# Patient Record
Sex: Female | Born: 1975 | Race: White | Hispanic: No | Smoking: Never smoker
Health system: Southern US, Community
[De-identification: ages and names within clinical notes are randomized; demographics above are authoritative.]

## PROBLEM LIST (undated history)

## (undated) DIAGNOSIS — N200 Calculus of kidney: Secondary | ICD-10-CM

## (undated) DIAGNOSIS — E119 Type 2 diabetes mellitus without complications: Secondary | ICD-10-CM

## (undated) DIAGNOSIS — I10 Essential (primary) hypertension: Secondary | ICD-10-CM

## (undated) DIAGNOSIS — K589 Irritable bowel syndrome without diarrhea: Secondary | ICD-10-CM

## (undated) DIAGNOSIS — K859 Acute pancreatitis without necrosis or infection, unspecified: Secondary | ICD-10-CM

## (undated) HISTORY — PX: OTHER SURGICAL HISTORY: SHX169

## (undated) HISTORY — PX: BLADDER SURGERY: SHX569

## (undated) HISTORY — PX: ABDOMINAL HYSTERECTOMY: SHX81

---

## 2013-02-16 DIAGNOSIS — R109 Unspecified abdominal pain: Secondary | ICD-10-CM | POA: Insufficient documentation

## 2013-02-16 DIAGNOSIS — R112 Nausea with vomiting, unspecified: Secondary | ICD-10-CM | POA: Insufficient documentation

## 2013-02-16 DIAGNOSIS — R197 Diarrhea, unspecified: Secondary | ICD-10-CM | POA: Insufficient documentation

## 2016-02-16 ENCOUNTER — Emergency Department
Admission: EM | Admit: 2016-02-16 | Discharge: 2016-02-16 | Disposition: A | Discharge status: Home / Self Care | Payer: Self-pay | Attending: Student in an Organized Health Care Education/Training Program | Admitting: Student in an Organized Health Care Education/Training Program

## 2016-02-16 ENCOUNTER — Encounter: Payer: Self-pay | Admitting: Emergency Medicine

## 2016-02-16 DIAGNOSIS — I10 Essential (primary) hypertension: Secondary | ICD-10-CM | POA: Insufficient documentation

## 2016-02-16 DIAGNOSIS — R197 Diarrhea, unspecified: Secondary | ICD-10-CM | POA: Insufficient documentation

## 2016-02-16 DIAGNOSIS — Z87891 Personal history of nicotine dependence: Secondary | ICD-10-CM | POA: Insufficient documentation

## 2016-02-16 DIAGNOSIS — M545 Low back pain, unspecified: Secondary | ICD-10-CM

## 2016-02-16 DIAGNOSIS — G8929 Other chronic pain: Secondary | ICD-10-CM | POA: Insufficient documentation

## 2016-02-16 DIAGNOSIS — R1013 Epigastric pain: Secondary | ICD-10-CM

## 2016-02-16 HISTORY — DX: Essential (primary) hypertension: I10

## 2016-02-16 HISTORY — DX: Irritable bowel syndrome, unspecified: K58.9

## 2016-02-16 HISTORY — DX: Acute pancreatitis without necrosis or infection, unspecified: K85.90

## 2016-02-16 LAB — URINALYSIS COMPLETE WITH MICROSCOPIC (ARMC ONLY)
BILIRUBIN URINE: NEGATIVE
Bacteria, UA: NONE SEEN
GLUCOSE, UA: NEGATIVE mg/dL
HGB URINE DIPSTICK: NEGATIVE
KETONES UR: NEGATIVE mg/dL
LEUKOCYTES UA: NEGATIVE
NITRITE: NEGATIVE
PH: 5 (ref 5.0–8.0)
Protein, ur: 30 mg/dL — AB
SPECIFIC GRAVITY, URINE: 1.023 (ref 1.005–1.030)
Squamous Epithelial / LPF: NONE SEEN

## 2016-02-16 LAB — CBC
HCT: 41.8 % (ref 35.0–47.0)
Hemoglobin: 14.8 g/dL (ref 12.0–16.0)
MCH: 32.3 pg (ref 26.0–34.0)
MCHC: 35.4 g/dL (ref 32.0–36.0)
MCV: 91.2 fL (ref 80.0–100.0)
PLATELETS: 256 10*3/uL (ref 150–440)
RBC: 4.58 MIL/uL (ref 3.80–5.20)
RDW: 12.8 % (ref 11.5–14.5)
WBC: 8.1 10*3/uL (ref 3.6–11.0)

## 2016-02-16 LAB — COMPREHENSIVE METABOLIC PANEL
ALT: 23 U/L (ref 14–54)
AST: 26 U/L (ref 15–41)
Albumin: 4.4 g/dL (ref 3.5–5.0)
Alkaline Phosphatase: 72 U/L (ref 38–126)
Anion gap: 5 (ref 5–15)
BILIRUBIN TOTAL: 0.3 mg/dL (ref 0.3–1.2)
BUN: 19 mg/dL (ref 6–20)
CHLORIDE: 109 mmol/L (ref 101–111)
CO2: 26 mmol/L (ref 22–32)
CREATININE: 0.92 mg/dL (ref 0.44–1.00)
Calcium: 9.7 mg/dL (ref 8.9–10.3)
Glucose, Bld: 107 mg/dL — ABNORMAL HIGH (ref 65–99)
Potassium: 4 mmol/L (ref 3.5–5.1)
Sodium: 140 mmol/L (ref 135–145)
TOTAL PROTEIN: 7.4 g/dL (ref 6.5–8.1)

## 2016-02-16 LAB — LIPASE, BLOOD: LIPASE: 36 U/L (ref 11–51)

## 2016-02-16 MED ORDER — CYCLOBENZAPRINE HCL 5 MG PO TABS: 5.0000 mg | ORAL_TABLET | Freq: Three times a day (TID) | ORAL | 0 refills | Status: DC | PRN

## 2016-02-16 MED ORDER — DICYCLOMINE HCL 20 MG PO TABS: 20.0000 mg | ORAL_TABLET | Freq: Three times a day (TID) | ORAL | 0 refills | Status: DC | PRN

## 2016-02-16 MED ORDER — RANITIDINE HCL 150 MG PO TABS: 150.0000 mg | ORAL_TABLET | Freq: Two times a day (BID) | ORAL | 1 refills | Status: DC

## 2016-02-16 MED ORDER — GI COCKTAIL ~~LOC~~
30.0000 mL | Freq: Once | ORAL | Status: AC
  Administered 2016-02-16: 30 mL via ORAL
  Filled 2016-02-16: qty 30

## 2016-02-16 NOTE — ED Triage Notes (Signed)
Pt reports for the past 6 weeks epigastric pain multiple of diarrhea. 4 times today. Nausea denies vomiting. Denies fever. Reports lower back pain and tried otc Azo with no relief.

## 2016-02-16 NOTE — ED Provider Notes (Signed)
[facility] Emergency Department Provider Note    First MD Initiated Contact with Patient 02/16/16 1706     (approximate)  I have reviewed the triage vital signs and the nursing notes.   HISTORY  Chief Complaint Diarrhea and Abdominal Pain    HPI Tina Brennan is a 40 y.o. female with history of pancreatitis, IBS and hypertension currently off of her chronic medications including dicyclomine presents with 6 weeks of epigastric discomfort to bilateral flank pain and diarrhea. Denies any bloody stools. States that she does have burning sensation in the mid epigastrium. No melena. States that she has been taking 800 -1,000 mg of Motrin 3 times daily for back pain. Denies any hematuria. States she has a history of nephrolithiasis but this feels different. Patient is currently moving from [place] and looking to establish care here in [place].   Past Medical History:  Diagnosis Date  . Hypertension   . IBS (irritable bowel syndrome)   . Pancreatitis     There are no active problems to display for this patient.   Past Surgical History:  Procedure Laterality Date  . ABDOMINAL HYSTERECTOMY    . BLADDER SURGERY    . cyst removal of ovaries      Prior to Admission medications   Not on File    Allergies Penicillins; Compazine [prochlorperazine edisylate]; Demerol [meperidine]; Reglan [metoclopramide]; and Toradol [ketorolac tromethamine]  No family history on file.  Social History Social History  Substance Use Topics  . Smoking status: Former Games developermoker  . Smokeless tobacco: Never Used  . Alcohol use No    Review of Systems Patient denies headaches, rhinorrhea, blurry vision, numbness, shortness of breath, chest pain, edema, cough, abdominal pain, nausea, vomiting, diarrhea, dysuria, fevers, rashes or hallucinations unless otherwise stated above in HPI. ____________________________________________   PHYSICAL EXAM:  VITAL SIGNS: Vitals:   02/16/16 1457  BP: (!) 176/108  Pulse: (!) 103  Resp: 20  Temp: 98 F (36.7 C)    Constitutional: Alert and oriented. Well appearing and in no acute distress. Eyes: Conjunctivae are normal. PERRL. EOMI. Head: Atraumatic. Nose: No congestion/rhinnorhea. Mouth/Throat: Mucous membranes are moist.  Oropharynx non-erythematous. Neck: No stridor. Painless ROM. No cervical spine tenderness to palpation Hematological/Lymphatic/Immunilogical: No cervical lymphadenopathy. Cardiovascular: Normal rate, regular rhythm. Grossly normal heart sounds.  Good peripheral circulation. Respiratory: Normal respiratory effort.  No retractions. Lungs CTAB. Gastrointestinal: Soft and nontender. No distention. No abdominal bruits. No CVA tenderness. Musculoskeletal: No lower extremity tenderness nor edema.  No joint effusions. Neurologic:  Normal speech and language. No gross focal neurologic deficits are appreciated. No gait instability. Skin:  Skin is warm, dry and intact. No rash noted. Psychiatric: Mood and affect are normal. Speech and behavior are normal.  ____________________________________________   LABS (all labs ordered are listed, but only abnormal results are displayed)  Results for orders placed or performed during the hospital encounter of 02/16/16 (from the past 24 hour(s))  Lipase, blood     Status: None   Collection Time: 02/16/16  3:09 PM  Result Value Ref Range   Lipase 36 11 - 51 U/L  Comprehensive metabolic panel     Status: Abnormal   Collection Time: 02/16/16  3:09 PM  Result Value Ref Range   Sodium 140 135 - 145 mmol/L   Potassium 4.0 3.5 - 5.1 mmol/L   Chloride 109 101 - 111 mmol/L   CO2 26 22 - 32 mmol/L   Glucose, Bld 107 (H) 65 - 99 mg/dL  BUN 19 6 - 20 mg/dL   Creatinine, Ser 1.61 0.44 - 1.00 mg/dL   Calcium 9.7 8.9 - 09.6 mg/dL   Total Protein 7.4 6.5 - 8.1 g/dL   Albumin 4.4 3.5 - 5.0 g/dL   AST 26 15 - 41 U/L   ALT 23 14 - 54 U/L   Alkaline Phosphatase 72 38 -  126 U/L   Total Bilirubin 0.3 0.3 - 1.2 mg/dL   GFR calc non Af Amer >60 >60 mL/min   GFR calc Af Amer >60 >60 mL/min   Anion gap 5 5 - 15  CBC     Status: None   Collection Time: 02/16/16  3:09 PM  Result Value Ref Range   WBC 8.1 3.6 - 11.0 K/uL   RBC 4.58 3.80 - 5.20 MIL/uL   Hemoglobin 14.8 12.0 - 16.0 g/dL   HCT 04.5 40.9 - 81.1 %   MCV 91.2 80.0 - 100.0 fL   MCH 32.3 26.0 - 34.0 pg   MCHC 35.4 32.0 - 36.0 g/dL   RDW 91.4 78.2 - 95.6 %   Platelets 256 150 - 440 K/uL  Urinalysis complete, with microscopic     Status: Abnormal   Collection Time: 02/16/16  3:09 PM  Result Value Ref Range   Color, Urine YELLOW (A) YELLOW   APPearance CLEAR (A) CLEAR   Glucose, UA NEGATIVE NEGATIVE mg/dL   Bilirubin Urine NEGATIVE NEGATIVE   Ketones, ur NEGATIVE NEGATIVE mg/dL   Specific Gravity, Urine 1.023 1.005 - 1.030   Hgb urine dipstick NEGATIVE NEGATIVE   pH 5.0 5.0 - 8.0   Protein, ur 30 (A) NEGATIVE mg/dL   Nitrite NEGATIVE NEGATIVE   Leukocytes, UA NEGATIVE NEGATIVE   RBC / HPF 0-5 0 - 5 RBC/hpf   WBC, UA 0-5 0 - 5 WBC/hpf   Bacteria, UA NONE SEEN NONE SEEN   Squamous Epithelial / LPF NONE SEEN NONE SEEN   Mucous PRESENT    ____________________________________________  EKG____________________________________________   PROCEDURES  Procedure(s) performed: none    Critical Care performed: no ____________________________________________   INITIAL IMPRESSION / ASSESSMENT AND PLAN / ED COURSE  Pertinent labs & imaging results that were available during my care of the patient were reviewed by me and considered in my medical decision making (see chart for details).  DDX: Gastritis, IBS, colitis, pyelo, stone, msk strain  Tina Brennan is a 40 y.o. who presents to the ED with 6 weeks of epigastric pain and bilateral flank pain and diarrhea. Patient is well-appearing afebrile and hemodynamically stable. Her abdominal exam is soft and benign. She has no CVA tenderness to  palpation. Laboratory evaluation ordered to assess for the above complaints shows no significant metabolic abnormality. She has no leukocytosis. Her urine is without evidence of pyuria, bacteriuria or hematuria. I do suspect that she has some component of gastritis secondary to recurrent NSAID use. Discussed appropriate management of chronic back pain and avoidance of NSAIDs. Patient has no evidence of pancreatitis. She is afebrile do not suspect infectious colitis. She has no evidence of acute anemia. Based on her presentation and duration of symptoms I do not feel emergent CT imaging clinically indicated. We will restart her home medications and arrange for follow-up with primary care physician.  Patient was able to tolerate PO and was able to ambulate with a steady gait.  Have discussed with the patient and available family all diagnostics and treatments performed thus far and all questions were answered to the best of my  ability. The patient demonstrates understanding and agreement with plan.   Clinical Course     ____________________________________________   FINAL CLINICAL IMPRESSION(S) / ED DIAGNOSES  Final diagnoses:  Diarrhea, unspecified type  Chronic epigastric pain  Bilateral low back pain without sciatica      NEW MEDICATIONS STARTED DURING THIS VISIT:  New Prescriptions   No medications on file     Note:  This document was prepared using Dragon voice recognition software and may include unintentional dictation errors.    Willy Eddy, MD 02/16/16 1723

## 2016-05-22 ENCOUNTER — Emergency Department
Admission: EM | Admit: 2016-05-22 | Discharge: 2016-05-22 | Disposition: A | Discharge status: Home / Self Care | Payer: Self-pay | Attending: Emergency Medicine | Admitting: Emergency Medicine

## 2016-05-22 ENCOUNTER — Encounter: Payer: Self-pay | Admitting: *Deleted

## 2016-05-22 DIAGNOSIS — Z791 Long term (current) use of non-steroidal anti-inflammatories (NSAID): Secondary | ICD-10-CM | POA: Insufficient documentation

## 2016-05-22 DIAGNOSIS — I10 Essential (primary) hypertension: Secondary | ICD-10-CM | POA: Insufficient documentation

## 2016-05-22 DIAGNOSIS — Z87891 Personal history of nicotine dependence: Secondary | ICD-10-CM | POA: Insufficient documentation

## 2016-05-22 DIAGNOSIS — N390 Urinary tract infection, site not specified: Secondary | ICD-10-CM | POA: Insufficient documentation

## 2016-05-22 DIAGNOSIS — Z79899 Other long term (current) drug therapy: Secondary | ICD-10-CM | POA: Insufficient documentation

## 2016-05-22 LAB — URINALYSIS, COMPLETE (UACMP) WITH MICROSCOPIC
BACTERIA UA: NONE SEEN
Bilirubin Urine: NEGATIVE
Glucose, UA: NEGATIVE mg/dL
HGB URINE DIPSTICK: NEGATIVE
Ketones, ur: NEGATIVE mg/dL
NITRITE: NEGATIVE
PH: 7 (ref 5.0–8.0)
Protein, ur: NEGATIVE mg/dL
SPECIFIC GRAVITY, URINE: 1.008 (ref 1.005–1.030)

## 2016-05-22 LAB — CBC
HEMATOCRIT: 44.7 % (ref 35.0–47.0)
Hemoglobin: 15.8 g/dL (ref 12.0–16.0)
MCH: 32.3 pg (ref 26.0–34.0)
MCHC: 35.2 g/dL (ref 32.0–36.0)
MCV: 91.8 fL (ref 80.0–100.0)
PLATELETS: 226 10*3/uL (ref 150–440)
RBC: 4.87 MIL/uL (ref 3.80–5.20)
RDW: 12.9 % (ref 11.5–14.5)
WBC: 8.3 10*3/uL (ref 3.6–11.0)

## 2016-05-22 LAB — COMPREHENSIVE METABOLIC PANEL
ALBUMIN: 4.4 g/dL (ref 3.5–5.0)
ALK PHOS: 79 U/L (ref 38–126)
ALT: 32 U/L (ref 14–54)
AST: 44 U/L — AB (ref 15–41)
Anion gap: 10 (ref 5–15)
BILIRUBIN TOTAL: 1.2 mg/dL (ref 0.3–1.2)
BUN: 12 mg/dL (ref 6–20)
CALCIUM: 9.6 mg/dL (ref 8.9–10.3)
CO2: 24 mmol/L (ref 22–32)
CREATININE: 0.79 mg/dL (ref 0.44–1.00)
Chloride: 105 mmol/L (ref 101–111)
GFR calc Af Amer: >60 mL/min (ref >60)
GLUCOSE: 107 mg/dL — AB (ref 65–99)
Potassium: 4.5 mmol/L (ref 3.5–5.1)
Sodium: 139 mmol/L (ref 135–145)
TOTAL PROTEIN: 8 g/dL (ref 6.5–8.1)

## 2016-05-22 LAB — LIPASE, BLOOD: Lipase: 33 U/L (ref 11–51)

## 2016-05-22 MED ORDER — CLONIDINE HCL 0.1 MG PO TABS
0.1000 mg | ORAL_TABLET | Freq: Once | ORAL | Status: AC
  Administered 2016-05-22: 0.1 mg via ORAL
  Filled 2016-05-22: qty 1

## 2016-05-22 MED ORDER — CLONIDINE HCL 0.1 MG PO TABS: 0.1000 mg | ORAL_TABLET | Freq: Two times a day (BID) | ORAL | 1 refills | Status: DC

## 2016-05-22 MED ORDER — CIPROFLOXACIN HCL 500 MG PO TABS: 500.0000 mg | ORAL_TABLET | Freq: Two times a day (BID) | ORAL | 0 refills | Status: DC

## 2016-05-22 NOTE — ED Provider Notes (Signed)
[facility] Emergency Department Provider Note   ____________________________________________    I have reviewed the triage vital signs and the nursing notes.   HISTORY  Chief Complaint Hypertension and Abdominal Pain     HPI Tina Brennan is a 40 y.o. female who presents for elevated blood pressure and left flank pain. Patient apparently has relatively chronic bilateral flank pain but reports this has been different and has been going on for the last 3 weeks. She also complains of a rash over the last 3 weeks which seems to be improving but she feels that it is keeping her from being able to control her blood pressure. She denies neuro deficits. No chest pain or shortness of breath. Typically she takes metoprolol and clonidine for blood pressure but she has run out of clonidine. recently relocated to the area and has not established primary care   Past Medical History:  Diagnosis Date  . Hypertension   . IBS (irritable bowel syndrome)   . Pancreatitis     There are no active problems to display for this patient.   Past Surgical History:  Procedure Laterality Date  . ABDOMINAL HYSTERECTOMY    . BLADDER SURGERY    . cyst removal of ovaries      Prior to Admission medications   Medication Sig Start Date End Date Taking? Authorizing Provider  cyclobenzaprine (FLEXERIL) 5 MG tablet Take 1 tablet (5 mg total) by mouth 3 (three) times daily as needed for muscle spasms. 02/16/16   Willy EddyPatrick Robinson, MD  dicyclomine (BENTYL) 20 MG tablet Take 1 tablet (20 mg total) by mouth 3 (three) times daily as needed for spasms. 02/16/16 02/15/17  Willy EddyPatrick Robinson, MD  ranitidine (ZANTAC) 150 MG tablet Take 1 tablet (150 mg total) by mouth 2 (two) times daily. 02/16/16 02/15/17  Willy EddyPatrick Robinson, MD     Allergies Penicillins; Compazine [prochlorperazine edisylate]; Demerol [meperidine]; Reglan [metoclopramide]; and Toradol [ketorolac tromethamine]  History  reviewed. No pertinent family history.  Social History Social History  Substance Use Topics  . Smoking status: Former Games developermoker  . Smokeless tobacco: Never Used  . Alcohol use No    Review of Systems  Constitutional: No fever/chills Eyes: No visual changes.  ENT: No sore throat. Cardiovascular: Denies chest pain. Respiratory: Denies shortness of breath. Gastrointestinal: Left flank pain as above Genitourinary: Negative for dysuria. No frequency Musculoskeletal: Negative for back pain. Skin: Negative for rash. Neurological: Intermittent headaches which she attributed to blood pressure  10-point ROS otherwise negative.  ____________________________________________   PHYSICAL EXAM:  VITAL SIGNS: ED Triage Vitals  Enc Vitals Group     BP 05/22/16 0741 (!) 200/116     Pulse Rate 05/22/16 0741 89     Resp 05/22/16 0741 18     Temp 05/22/16 0741 98 F (36.7 C)     Temp Source 05/22/16 0741 Oral     SpO2 05/22/16 0741 100 %     Weight 05/22/16 0742 207 lb 3.7 oz (94 kg)     Height 05/22/16 0742 5' (1.524 m)     Head Circumference --      Peak Flow --      Pain Score 05/22/16 0742 7     Pain Loc --      Pain Edu? --      Excl. in GC? --     Constitutional: Alert and oriented. No acute distress. Pleasant and interactive Eyes: Conjunctivae are normal. PERRLA  Nose: No congestion/rhinnorhea. Mouth/Throat: Mucous membranes  are moist.   Neck:  Painless ROM Cardiovascular: Normal rate, regular rhythm. Grossly normal heart sounds.  Good peripheral circulation. Respiratory: Normal respiratory effort.  No retractions. Lungs CTAB. Gastrointestinal: Soft and nontender. No distention. Mild left CVA tenderness Genitourinary: deferred Musculoskeletal: No lower extremity tenderness nor edema.  Warm and well perfused Neurologic:  Normal speech and language. No gross focal neurologic deficits are appreciated.  Skin:  Skin is warm, dry and intact. Resolving rash to the arms and chest,  nonspecific Psychiatric: Mood and affect are normal. Speech and behavior are normal.  ____________________________________________   LABS (all labs ordered are listed, but only abnormal results are displayed)  Labs Reviewed  LIPASE, BLOOD  COMPREHENSIVE METABOLIC PANEL  CBC  URINALYSIS, COMPLETE (UACMP) WITH MICROSCOPIC   ____________________________________________  EKG  None ____________________________________________  RADIOLOGY  None ____________________________________________   PROCEDURES  Procedure(s) performed: No    Critical Care performed: No ____________________________________________   INITIAL IMPRESSION / ASSESSMENT AND PLAN / ED COURSE  Pertinent labs & imaging results that were available during my care of the patient were reviewed by me and considered in my medical decision making (see chart for details).  Patient overall well-appearing and in no acute distress. Her blood pressure is elevated, we will give  her by mouth clonidine which she typically takes at home. We will check labs and urine and reevaluate.  Clinical Course   Patient given clonidine which improved her blood pressure. She is reassured by this. We discussed obtaining CT imaging for her side pain but she decided to abstain as she has had several CT scans in the past. Her urine demonstrates a few white blood cells and small amount of leukocytes, prescription written in case she did ultimately dysuria. She knows to return if any worsening of her symptoms. ____________________________________________   FINAL CLINICAL IMPRESSION(S) / ED DIAGNOSES  Final diagnoses:  Essential hypertension  Lower urinary tract infectious disease      NEW MEDICATIONS STARTED DURING THIS VISIT:  New Prescriptions   No medications on file     Note:  This document was prepared using Dragon voice recognition software and may include unintentional dictation errors.    Jene Everyobert Melbert Botelho, MD 05/22/16  1351

## 2016-05-22 NOTE — ED Triage Notes (Signed)
Pt arrives with complaints of high BP for several weeks, states she has been taking metoprolol and clonodine but states she has been out of clonidine, states left sided abd pain with nausea, states feeling achy, awake and alert in no acute distress

## 2016-10-06 ENCOUNTER — Encounter: Payer: Self-pay | Admitting: Emergency Medicine

## 2016-10-06 ENCOUNTER — Emergency Department
Admission: EM | Admit: 2016-10-06 | Discharge: 2016-10-06 | Disposition: A | Discharge status: Home / Self Care | Payer: Self-pay | Attending: Emergency Medicine | Admitting: Emergency Medicine

## 2016-10-06 DIAGNOSIS — Z79899 Other long term (current) drug therapy: Secondary | ICD-10-CM | POA: Insufficient documentation

## 2016-10-06 DIAGNOSIS — I10 Essential (primary) hypertension: Secondary | ICD-10-CM | POA: Insufficient documentation

## 2016-10-06 DIAGNOSIS — Z760 Encounter for issue of repeat prescription: Secondary | ICD-10-CM

## 2016-10-06 LAB — CBC
HEMATOCRIT: 40.8 % (ref 35.0–47.0)
Hemoglobin: 14.3 g/dL (ref 12.0–16.0)
MCH: 32 pg (ref 26.0–34.0)
MCHC: 35 g/dL (ref 32.0–36.0)
MCV: 91.5 fL (ref 80.0–100.0)
PLATELETS: 268 10*3/uL (ref 150–440)
RBC: 4.45 MIL/uL (ref 3.80–5.20)
RDW: 12.8 % (ref 11.5–14.5)
WBC: 7 10*3/uL (ref 3.6–11.0)

## 2016-10-06 LAB — URINALYSIS, COMPLETE (UACMP) WITH MICROSCOPIC
BACTERIA UA: NONE SEEN
BILIRUBIN URINE: NEGATIVE
Glucose, UA: NEGATIVE mg/dL
Hgb urine dipstick: NEGATIVE
Ketones, ur: NEGATIVE mg/dL
Leukocytes, UA: NEGATIVE
Nitrite: NEGATIVE
PH: 5 (ref 5.0–8.0)
Protein, ur: NEGATIVE mg/dL
Specific Gravity, Urine: 1.016 (ref 1.005–1.030)

## 2016-10-06 LAB — BASIC METABOLIC PANEL
Anion gap: 7 (ref 5–15)
BUN: 14 mg/dL (ref 6–20)
CHLORIDE: 103 mmol/L (ref 101–111)
CO2: 27 mmol/L (ref 22–32)
CREATININE: 0.8 mg/dL (ref 0.44–1.00)
Calcium: 9.4 mg/dL (ref 8.9–10.3)
GFR calc Af Amer: >60 mL/min (ref >60)
GFR calc non Af Amer: >60 mL/min (ref >60)
GLUCOSE: 84 mg/dL (ref 65–99)
POTASSIUM: 4.2 mmol/L (ref 3.5–5.1)
SODIUM: 137 mmol/L (ref 135–145)

## 2016-10-06 MED ORDER — LISINOPRIL 20 MG PO TABS: 20.0000 mg | ORAL_TABLET | Freq: Every day | ORAL | 2 refills | Status: DC

## 2016-10-06 MED ORDER — METOPROLOL SUCCINATE ER 50 MG PO TB24: 50.0000 mg | ORAL_TABLET | Freq: Every day | ORAL | 2 refills | Status: DC

## 2016-10-06 MED ORDER — DICYCLOMINE HCL 20 MG PO TABS: 20.0000 mg | ORAL_TABLET | Freq: Three times a day (TID) | ORAL | 2 refills | Status: DC | PRN

## 2016-10-06 NOTE — ED Notes (Signed)
Pt attempted to urinate but was unsuccessful. Pt sent back out to lobby with specimen cup for when is able to void.

## 2016-10-06 NOTE — ED Notes (Signed)
Pt reports she has been off of regular medications for BP and acid reflux [place] and no PCP in area. Pt recently seen in [place] at ED, given Metoprolol 50mg  twice a day, pt not seeing decrease or regularity of BP since being on medication since.

## 2016-10-06 NOTE — ED Provider Notes (Signed)
[facility] Emergency Department Provider Note  ____________________________________________  Time seen: Approximately 8:06 PM  I have reviewed the triage vital signs and the nursing notes.   HISTORY  Chief Complaint Hypertension    HPI Tina Brennan is a 41 y.o. female request refill of her blood pressure medication. She is recently moved to [place] from [place]. In [place] she was taking metoprolol as well as lisinopril. [facility] months ago with a prescribed metoprolol but not lisinopril. She's been taking the metoprolol without significant control of her blood pressure. She has some occasional generalized headaches are not severe and not associated with vision changes numbness tingling weakness or syncope.     Past Medical History:  Diagnosis Date  . Hypertension   . IBS (irritable bowel syndrome)   . Pancreatitis      There are no active problems to display for this patient.    Past Surgical History:  Procedure Laterality Date  . ABDOMINAL HYSTERECTOMY    . BLADDER SURGERY    . cyst removal of ovaries       Prior to Admission medications   Medication Sig Start Date End Date Taking? Authorizing Provider  ciprofloxacin (CIPRO) 500 MG tablet Take 1 tablet (500 mg total) by mouth 2 (two) times daily. 05/22/16   Jene EveryKinner, Robert, MD  cloNIDine (CATAPRES) 0.1 MG tablet Take 1 tablet (0.1 mg total) by mouth 2 (two) times daily. 05/22/16 05/22/17  Jene EveryKinner, Robert, MD  cyclobenzaprine (FLEXERIL) 5 MG tablet Take 1 tablet (5 mg total) by mouth 3 (three) times daily as needed for muscle spasms. Patient not taking: Reported on 05/22/2016 02/16/16   Willy Eddyobinson, Patrick, MD  ibuprofen (ADVIL,MOTRIN) 800 MG tablet Take 800 mg by mouth 2 (two) times daily.    [provider]  lisinopril (PRINIVIL,ZESTRIL) 20 MG tablet Take 1 tablet (20 mg total) by mouth daily. 10/06/16 10/06/17  Sharman CheekStafford, Mallissa Lorenzen, MD  metoprolol succinate  (TOPROL XL) 50 MG 24 hr tablet Take 1 tablet (50 mg total) by mouth daily. Take with or immediately following a meal. 10/06/16 10/06/17  Sharman CheekStafford, Chaselyn Nanney, MD  ranitidine (ZANTAC) 150 MG tablet Take 1 tablet (150 mg total) by mouth 2 (two) times daily. 02/16/16 02/15/17  Willy Eddyobinson, Patrick, MD     Allergies Penicillins; Compazine [prochlorperazine edisylate]; Demerol [meperidine]; Reglan [metoclopramide]; and Toradol [ketorolac tromethamine]   History reviewed. No pertinent family history.  Social History Social History  Substance Use Topics  . Smoking status: Never Smoker  . Smokeless tobacco: Never Used  . Alcohol use No    Review of Systems  Constitutional:   No fever or chills.  ENT:   No sore throat. No rhinorrhea. Lymphatic: No swollen glands, No extremity swelling Endocrine: No hot/cold flashes. No significant weight change. No neck swelling. Cardiovascular:   No chest pain or syncope. Respiratory:   No dyspnea or cough. Gastrointestinal:   Negative for abdominal pain, vomiting and diarrhea.  Genitourinary:   Negative for dysuria or difficulty urinating. Musculoskeletal:   Negative for focal pain or swelling Neurological:   Positive for occasional headaches.. All other systems reviewed and are negative except as documented above in ROS and HPI.  ____________________________________________   PHYSICAL EXAM:  VITAL SIGNS: ED Triage Vitals  Enc Vitals Group     BP 10/06/16 1628 (!) 195/120     Pulse Rate 10/06/16 1628 78     Resp 10/06/16 1628 18     Temp 10/06/16 1628 99 F (37.2  C)     Temp Source 10/06/16 1628 Oral     SpO2 10/06/16 1628 98 %     Weight 10/06/16 1628 240 lb (108.9 kg)     Height 10/06/16 1628 5\' 4"  (1.626 m)     Head Circumference --      Peak Flow --      Pain Score 10/06/16 1627 3     Pain Loc --      Pain Edu? --      Excl. in GC? --     Vital signs reviewed, nursing assessments reviewed.   Constitutional:   Alert and oriented. Well  appearing and in no distress. Eyes:   No scleral icterus. No conjunctival pallor. PERRL. EOMI.  No nystagmus. ENT   Head:   Normocephalic and atraumatic.   Nose:   No congestion/rhinnorhea. No septal hematoma   Mouth/Throat:   MMM, no pharyngeal erythema. No peritonsillar mass.    Neck:   No stridor. No SubQ emphysema. No meningismus. Hematological/Lymphatic/Immunilogical:   No cervical lymphadenopathy. Cardiovascular:   RRR. Symmetric bilateral radial and DP pulses.  No murmurs.  Respiratory:   Normal respiratory effort without tachypnea nor retractions. Breath sounds are clear and equal bilaterally. No wheezes/rales/rhonchi. Gastrointestinal:   Soft and nontender. Non distended. There is no CVA tenderness.  No rebound, rigidity, or guarding. Genitourinary:   deferred Musculoskeletal:   Normal range of motion in all extremities. No joint effusions.  No lower extremity tenderness.  No edema. Neurologic:   Normal speech and language.  CN 2-10 normal. Motor grossly intact. No gross focal neurologic deficits are appreciated.  Skin:    Skin is warm, dry and intact. No rash noted.  No petechiae, purpura, or bullae.  ____________________________________________    LABS (pertinent positives/negatives) (all labs ordered are listed, but only abnormal results are displayed) Labs Reviewed  URINALYSIS, COMPLETE (UACMP) WITH MICROSCOPIC - Abnormal; Notable for the following:       Result Value   Color, Urine YELLOW (*)    APPearance CLEAR (*)    Squamous Epithelial / LPF 0-5 (*)    All other components within normal limits  BASIC METABOLIC PANEL  CBC  CBG MONITORING, ED   ____________________________________________   EKG  Interpreted by me  Date: 10/06/2016  Rate: 83  Rhythm: normal sinus rhythm  QRS Axis: normal  Intervals: normal  ST/T Wave abnormalities: normal  Conduction Disutrbances: none  Narrative Interpretation:  unremarkable      ____________________________________________    RADIOLOGY  No results found.  ____________________________________________   PROCEDURES Procedures  ____________________________________________   INITIAL IMPRESSION / ASSESSMENT AND PLAN / ED COURSE  Pertinent labs & imaging results that were available during my care of the patient were reviewed by me and considered in my medical decision making (see chart for details).  Patient well appearing no acute distress, presents for medication refill. Has some headaches but neurologically intact.Considering the patient's symptoms, medical history, and physical examination today, I have low suspicion for ischemic stroke, intracranial hemorrhage, meningitis, encephalitis, carotid or vertebral dissection, venous sinus thrombosis, MS, intracranial hypertension, glaucoma, CRAO, CRVO, or temporal arteritis. Refill metoprolol. Refill*Lisinopril that she was also taking in [place]. Refill dicyclomine. Given info for sliding scale clinics.     Clinical Course as of Oct 06 2004  Mon Oct 06, 2016  1945 Calcium: 9.4 [PS]    Clinical Course User Index [PS] Sharman Cheek, MD     ____________________________________________   FINAL CLINICAL IMPRESSION(S) / ED DIAGNOSES  Final diagnoses:  Essential hypertension  Encounter for medication refill      New Prescriptions   LISINOPRIL (PRINIVIL,ZESTRIL) 20 MG TABLET    Take 1 tablet (20 mg total) by mouth daily.   METOPROLOL SUCCINATE (TOPROL XL) 50 MG 24 HR TABLET    Take 1 tablet (50 mg total) by mouth daily. Take with or immediately following a meal.     Portions of this note were generated with dragon dictation software. Dictation errors may occur despite best attempts at proofreading.    Sharman Cheek, MD 10/06/16 2013

## 2016-12-07 ENCOUNTER — Encounter: Payer: Self-pay | Admitting: *Deleted

## 2016-12-07 ENCOUNTER — Emergency Department
Admission: EM | Admit: 2016-12-07 | Discharge: 2016-12-07 | Disposition: A | Discharge status: Home / Self Care | Payer: Self-pay | Attending: Emergency Medicine | Admitting: Emergency Medicine

## 2016-12-07 DIAGNOSIS — K224 Dyskinesia of esophagus: Secondary | ICD-10-CM | POA: Insufficient documentation

## 2016-12-07 DIAGNOSIS — I10 Essential (primary) hypertension: Secondary | ICD-10-CM | POA: Insufficient documentation

## 2016-12-07 DIAGNOSIS — K2100 Gastro-esophageal reflux disease with esophagitis, without bleeding: Secondary | ICD-10-CM

## 2016-12-07 DIAGNOSIS — Z791 Long term (current) use of non-steroidal anti-inflammatories (NSAID): Secondary | ICD-10-CM | POA: Insufficient documentation

## 2016-12-07 DIAGNOSIS — Z79899 Other long term (current) drug therapy: Secondary | ICD-10-CM | POA: Insufficient documentation

## 2016-12-07 MED ORDER — FAMOTIDINE 20 MG PO TABS
40.0000 mg | ORAL_TABLET | Freq: Once | ORAL | Status: AC
  Administered 2016-12-07: 40 mg via ORAL
  Filled 2016-12-07: qty 2

## 2016-12-07 MED ORDER — METOPROLOL SUCCINATE ER 50 MG PO TB24: 50.0000 mg | ORAL_TABLET | Freq: Every day | ORAL | 2 refills | Status: DC

## 2016-12-07 MED ORDER — LISINOPRIL 20 MG PO TABS: 20.0000 mg | ORAL_TABLET | Freq: Every day | ORAL | 2 refills | Status: DC

## 2016-12-07 MED ORDER — OMEPRAZOLE 20 MG PO CPDR: 20.0000 mg | DELAYED_RELEASE_CAPSULE | Freq: Every day | ORAL | 1 refills | Status: DC

## 2016-12-07 MED ORDER — AMLODIPINE BESYLATE 5 MG PO TABS
5.0000 mg | ORAL_TABLET | Freq: Once | ORAL | Status: AC
  Administered 2016-12-07: 5 mg via ORAL
  Filled 2016-12-07: qty 1

## 2016-12-07 MED ORDER — DICYCLOMINE HCL 20 MG PO TABS: 20.0000 mg | ORAL_TABLET | Freq: Three times a day (TID) | ORAL | 1 refills | Status: DC | PRN

## 2016-12-07 MED ORDER — AMLODIPINE BESYLATE 5 MG PO TABS: 5.0000 mg | ORAL_TABLET | Freq: Every day | ORAL | 2 refills | Status: DC

## 2016-12-07 NOTE — ED Provider Notes (Signed)
[facility] Emergency Department Provider Note ____________________________________________  Time seen: 1730  I have reviewed the triage vital signs and the nursing notes.  HISTORY  Chief Complaint  Sore Throat  HPI Tina Brennan is a 41 y.o. female presents to the ED for evaluation of sore throat after a choking episode today. She reports eating diced chicken today, when she experienced the chicken getting stuck in her throat. She reports she has had increasing episodes of choking and food getting stuck in her throat over the last several (8-10) months. She describes trying to drink water to dislodge food, but that induces vomiting. She also notes a history of reflux, which is poorly controlled with OTC Zantac. She is unable to sleep lying down, due to severe heartburn. She also admits to dental pain due to a broken molar, for which she has been taking large, frequent doses of ibuprofen. She notes her blood pressure remains elevated despite being on two different medications. She is new to the area, and has not established care with a primary provider.   Past Medical History:  Diagnosis Date  . Hypertension   . IBS (irritable bowel syndrome)   . Pancreatitis     There are no active problems to display for this patient.   Past Surgical History:  Procedure Laterality Date  . ABDOMINAL HYSTERECTOMY    . BLADDER SURGERY    . cyst removal of ovaries      Prior to Admission medications   Medication Sig Start Date End Date Taking? Authorizing Provider  amLODipine (NORVASC) 5 MG tablet Take 1 tablet (5 mg total) by mouth daily. 12/07/16 03/07/17  Akhilesh Sassone, Charlesetta IvoryJenise V Bacon, PA-C  ciprofloxacin (CIPRO) 500 MG tablet Take 1 tablet (500 mg total) by mouth 2 (two) times daily. 05/22/16   Jene EveryKinner, Robert, MD  cloNIDine (CATAPRES) 0.1 MG tablet Take 1 tablet (0.1 mg total) by mouth 2 (two) times daily. 05/22/16 05/22/17  Jene EveryKinner, Robert, MD  cyclobenzaprine (FLEXERIL) 5 MG  tablet Take 1 tablet (5 mg total) by mouth 3 (three) times daily as needed for muscle spasms. Patient not taking: Reported on 05/22/2016 02/16/16   Willy Eddyobinson, Patrick, MD  dicyclomine (BENTYL) 20 MG tablet Take 1 tablet (20 mg total) by mouth 3 (three) times daily as needed for spasms. 10/06/16   Sharman CheekStafford, Phillip, MD  dicyclomine (BENTYL) 20 MG tablet Take 1 tablet (20 mg total) by mouth 3 (three) times daily as needed for spasms. 12/07/16 02/05/17  Gurpreet Mikhail, Charlesetta IvoryJenise V Bacon, PA-C  ibuprofen (ADVIL,MOTRIN) 800 MG tablet Take 800 mg by mouth 2 (two) times daily.    [provider]  lisinopril (PRINIVIL,ZESTRIL) 20 MG tablet Take 1 tablet (20 mg total) by mouth daily. 10/06/16 10/06/17  Sharman CheekStafford, Phillip, MD  lisinopril (PRINIVIL,ZESTRIL) 20 MG tablet Take 1 tablet (20 mg total) by mouth daily. 12/07/16 03/07/17  Kagen Kunath, Charlesetta IvoryJenise V Bacon, PA-C  metoprolol succinate (TOPROL XL) 50 MG 24 hr tablet Take 1 tablet (50 mg total) by mouth daily. Take with or immediately following a meal. 10/06/16 10/06/17  Sharman CheekStafford, Phillip, MD  metoprolol succinate (TOPROL XL) 50 MG 24 hr tablet Take 1 tablet (50 mg total) by mouth daily. Take with or immediately following a meal. 12/07/16 03/07/17  Graylin Sperling, Charlesetta IvoryJenise V Bacon, PA-C  omeprazole (PRILOSEC) 20 MG capsule Take 1 capsule (20 mg total) by mouth daily. 12/07/16 02/05/17  Carsin Randazzo, Charlesetta IvoryJenise V Bacon, PA-C  ranitidine (ZANTAC) 150 MG tablet Take 1 tablet (150 mg total) by mouth 2 (two)  times daily. 02/16/16 02/15/17  Willy Eddy, MD    Allergies Penicillins; Compazine [prochlorperazine edisylate]; Demerol [meperidine]; Reglan [metoclopramide]; and Toradol [ketorolac tromethamine]  History reviewed. No pertinent family history.  Social History Social History  Substance Use Topics  . Smoking status: Never Smoker  . Smokeless tobacco: Never Used  . Alcohol use No    Review of Systems  Constitutional: Negative for fever. Eyes: Negative for visual changes. ENT: Positive for sore  throat. Cardiovascular: Negative for chest pain. Respiratory: Negative for shortness of breath. Gastrointestinal: Negative for abdominal pain and diarrhea. Reports gagging, vomiting, and reflux as above.  Neurological: Negative for headaches, focal weakness or numbness. ____________________________________________  PHYSICAL EXAM:  VITAL SIGNS: ED Triage Vitals  Enc Vitals Group     BP 12/07/16 1637 (!) 186/102     Pulse Rate 12/07/16 1637 82     Resp 12/07/16 1637 18     Temp 12/07/16 1637 98.6 F (37 C)     Temp Source 12/07/16 1637 Oral     SpO2 12/07/16 1637 96 %     Weight 12/07/16 1638 180 lb (81.6 kg)     Height 12/07/16 1638 5\' 1"  (1.549 m)     Head Circumference --      Peak Flow --      Pain Score --      Pain Loc --      Pain Edu? --      Excl. in GC? --     Constitutional: Alert and oriented. Well appearing and in no distress. Head: Normocephalic and atraumatic. Eyes: Conjunctivae are normal. PERRL. Normal extraocular movements Ears: Canals clear. TMs intact bilaterally. Nose: No congestion/rhinorrhea/epistaxis. Mouth/Throat: Mucous membranes are moist. Uvula is midline and tonsils are flat. No oropharyngeal erythema, lesions, exudate appreciated. Patient is noted to have poor dentition with multiple cavities and chronically broken teeth noted. No focal gum swelling, erythema, or fluctuance noted. No sublingual swelling is appreciated. Neck: Supple. No thyromegaly. Hematological/Lymphatic/Immunological: No cervical lymphadenopathy. Cardiovascular: Normal rate, regular rhythm. Normal distal pulses. Respiratory: Normal respiratory effort. No wheezes/rales/rhonchi. Gastrointestinal: Soft and nontender. No distention. ____________________________________________  PROCEDURES  Famotidine 40 mg PO Amlodipine 5 mg PO ____________________________________________  INITIAL IMPRESSION / ASSESSMENT AND PLAN / ED COURSE  Patient with the ED evaluation of episodic  choking and gagging on foods. She reports she has chopped and cut her food into small pieces, still experiences increasing frequency of token injected on foods. She also reports her heartburn is severe in quality control. It turns out that the patient's increased doses of NSAIDs, and likely an adverse effect of both her GERD and hypertension. She may be experiencing esophagitis and possible esophageal spasm which is causing her to choke and have food stuck in the esophagus. She is advised this time to merely discontinue use of all NSAIDs. She is also started on a PPI as well as Norvasc for her poorly controlled hypertension. She will be given refills of her Bentyl, lisinopril, and metoprolol. She will be referred to local community clinics for ongoing symptom management. She is advised to monitor her blood pressure in the interim. She is also referred to gastroenterology for further evaluation and management. Return instructions are provided. ____________________________________________  FINAL CLINICAL IMPRESSION(S) / ED DIAGNOSES  Final diagnoses:  Gastroesophageal reflux disease with esophagitis  Essential hypertension  Esophageal dyskinesia      Karmen Stabs, Charlesetta Ivory, PA-C 12/09/16 1631    Sharman Cheek, MD 12/09/16 2313

## 2016-12-07 NOTE — Discharge Instructions (Signed)
You are being treated for uncontrolled blood pressure and reflux disease. You are likely experiencing increased heartburn and difficulty swallowing due to increased stomach acid production. This is, in part, due to excessive use of anti-inflammatory medicines (NSAIDs). Avoid taking these types of medicines; they can also adversely affect your blood pressure. You should follow-up with gastroenterology for definitive diagnosis and treatment. Follow-up with a local community clinic for routine medical care.

## 2016-12-07 NOTE — ED Notes (Signed)
Throat looks swollen, slightly red. Pt states she has had a history of difficulty swallowing x 8-9 months.

## 2016-12-07 NOTE — ED Triage Notes (Signed)
Pt to ED reporting sore throat after chocking on chicken earlier today. Pt reports meat and bread have been causing her to chock recently and she is scared because of a family hx of lymph node cancer. Pt reports throat is sore at the moment but denies swelling or feeling as though throat is closing up.  Pt reports having a hx of HTN and is currently taken prescription medications and medication are not working . PT reports having  Intermittent headaches but has recently moved and does not have a new PCP yet. RN in triage gave pt information on open clinics.

## 2017-01-11 ENCOUNTER — Emergency Department
Admission: EM | Admit: 2017-01-11 | Discharge: 2017-01-11 | Disposition: A | Discharge status: Home / Self Care | Payer: Self-pay | Attending: Emergency Medicine | Admitting: Emergency Medicine

## 2017-01-11 ENCOUNTER — Emergency Department: Payer: Self-pay

## 2017-01-11 DIAGNOSIS — R300 Dysuria: Secondary | ICD-10-CM | POA: Insufficient documentation

## 2017-01-11 DIAGNOSIS — R109 Unspecified abdominal pain: Secondary | ICD-10-CM | POA: Insufficient documentation

## 2017-01-11 DIAGNOSIS — I10 Essential (primary) hypertension: Secondary | ICD-10-CM | POA: Insufficient documentation

## 2017-01-11 DIAGNOSIS — Z79899 Other long term (current) drug therapy: Secondary | ICD-10-CM | POA: Insufficient documentation

## 2017-01-11 LAB — CBC WITH DIFFERENTIAL/PLATELET
BASOS ABS: 0 10*3/uL (ref 0–0.1)
Basophils Relative: 1 %
EOS PCT: 3 %
Eosinophils Absolute: 0.2 10*3/uL (ref 0–0.7)
HEMATOCRIT: 35.7 % (ref 35.0–47.0)
HEMOGLOBIN: 12.7 g/dL (ref 12.0–16.0)
LYMPHS ABS: 2.2 10*3/uL (ref 1.0–3.6)
LYMPHS PCT: 37 %
MCH: 32.4 pg (ref 26.0–34.0)
MCHC: 35.4 g/dL (ref 32.0–36.0)
MCV: 91.6 fL (ref 80.0–100.0)
Monocytes Absolute: 0.5 10*3/uL (ref 0.2–0.9)
Monocytes Relative: 9 %
NEUTROS ABS: 2.9 10*3/uL (ref 1.4–6.5)
NEUTROS PCT: 50 %
Platelets: 230 10*3/uL (ref 150–440)
RBC: 3.9 MIL/uL (ref 3.80–5.20)
RDW: 13.5 % (ref 11.5–14.5)
WBC: 5.8 10*3/uL (ref 3.6–11.0)

## 2017-01-11 LAB — URINALYSIS, ROUTINE W REFLEX MICROSCOPIC
Bilirubin Urine: NEGATIVE
GLUCOSE, UA: NEGATIVE mg/dL
HGB URINE DIPSTICK: NEGATIVE
Ketones, ur: NEGATIVE mg/dL
Leukocytes, UA: NEGATIVE
Nitrite: NEGATIVE
PH: 6 (ref 5.0–8.0)
Protein, ur: NEGATIVE mg/dL
SPECIFIC GRAVITY, URINE: 1.009 (ref 1.005–1.030)

## 2017-01-11 LAB — BASIC METABOLIC PANEL
Anion gap: 7 (ref 5–15)
BUN: 15 mg/dL (ref 6–20)
CHLORIDE: 107 mmol/L (ref 101–111)
CO2: 27 mmol/L (ref 22–32)
Calcium: 9.1 mg/dL (ref 8.9–10.3)
Creatinine, Ser: 0.84 mg/dL (ref 0.44–1.00)
GFR calc Af Amer: >60 mL/min (ref >60)
Glucose, Bld: 134 mg/dL — ABNORMAL HIGH (ref 65–99)
POTASSIUM: 4.2 mmol/L (ref 3.5–5.1)
Sodium: 141 mmol/L (ref 135–145)

## 2017-01-11 MED ORDER — HYDROMORPHONE HCL 1 MG/ML IJ SOLN: 2.0000 mg | Freq: Once | INTRAMUSCULAR | Status: DC

## 2017-01-11 MED ORDER — PROMETHAZINE HCL 25 MG PO TABS: 25.0000 mg | ORAL_TABLET | Freq: Four times a day (QID) | ORAL | 0 refills | Status: DC | PRN

## 2017-01-11 MED ORDER — SODIUM CHLORIDE 0.9 % IV BOLUS (SEPSIS): 1000.0000 mL | Freq: Once | INTRAVENOUS | Status: DC

## 2017-01-11 MED ORDER — PROMETHAZINE HCL 25 MG/ML IJ SOLN
25.0000 mg | Freq: Once | INTRAMUSCULAR | Status: DC
Start: 2017-01-11 — End: 2017-01-11

## 2017-01-11 MED ORDER — OXYCODONE-ACETAMINOPHEN 10-325 MG PO TABS: 1.0000 | ORAL_TABLET | Freq: Four times a day (QID) | ORAL | 0 refills | Status: DC | PRN

## 2017-01-11 MED ORDER — ONDANSETRON HCL 4 MG/2ML IJ SOLN: 4.0000 mg | Freq: Once | INTRAMUSCULAR | Status: DC

## 2017-01-11 MED ORDER — HYDROMORPHONE HCL 1 MG/ML IJ SOLN
1.0000 mg | Freq: Once | INTRAMUSCULAR | Status: AC
  Administered 2017-01-11: 1 mg via INTRAVENOUS

## 2017-01-11 MED ORDER — PROMETHAZINE HCL 25 MG/ML IJ SOLN
12.5000 mg | Freq: Four times a day (QID) | INTRAMUSCULAR | Status: DC | PRN
  Administered 2017-01-11: 12.5 mg via INTRAVENOUS
  Filled 2017-01-11: qty 1

## 2017-01-11 MED ORDER — OXYCODONE-ACETAMINOPHEN 5-325 MG PO TABS
2.0000 | ORAL_TABLET | Freq: Once | ORAL | Status: AC
  Administered 2017-01-11: 2 via ORAL
  Filled 2017-01-11: qty 2

## 2017-01-11 MED ORDER — HYDROMORPHONE HCL 1 MG/ML IJ SOLN
1.0000 mg | Freq: Once | INTRAMUSCULAR | Status: AC
  Administered 2017-01-11: 1 mg via INTRAVENOUS
  Filled 2017-01-11: qty 1

## 2017-01-11 MED ORDER — SODIUM CHLORIDE 0.9 % IV BOLUS (SEPSIS)
1000.0000 mL | Freq: Once | INTRAVENOUS | Status: AC
Start: 2017-01-11 — End: 2017-01-11
  Administered 2017-01-11: 1000 mL via INTRAVENOUS

## 2017-01-11 NOTE — ED Provider Notes (Signed)
[facility] Emergency Department Provider Note   ____________________________________________   First MD Initiated Contact with Patient 01/11/17 [phone]     (approximate)  I have reviewed the triage vital signs and the nursing notes.   HISTORY  Chief Complaint Back Pain and Dysuria    HPI Tina Brennan is a 41 y.o. female who presents to the ED from home with a chief complaint of right flank pain. Patient has a history of kidney stones requiring lithotripsy who reports onset of right flank pain and dysuria yesterday. She was able to take ibuprofen with relief of symptoms. This morning pain returned associated with nausea, no vomiting. Denies fever, chills, chest pain, shortness of breath, abdominal pain. Denies recent travel or trauma.   Past Medical History:  Diagnosis Date  . Hypertension   . IBS (irritable bowel syndrome)   . Pancreatitis     There are no active problems to display for this patient.   Past Surgical History:  Procedure Laterality Date  . ABDOMINAL HYSTERECTOMY    . BLADDER SURGERY    . cyst removal of ovaries      Prior to Admission medications   Medication Sig Start Date End Date Taking? Authorizing Provider  amLODipine (NORVASC) 5 MG tablet Take 1 tablet (5 mg total) by mouth daily. 12/07/16 03/07/17  Menshew, Charlesetta Ivory, PA-C  ciprofloxacin (CIPRO) 500 MG tablet Take 1 tablet (500 mg total) by mouth 2 (two) times daily. 05/22/16   Jene Every, MD  cloNIDine (CATAPRES) 0.1 MG tablet Take 1 tablet (0.1 mg total) by mouth 2 (two) times daily. 05/22/16 05/22/17  Jene Every, MD  cyclobenzaprine (FLEXERIL) 5 MG tablet Take 1 tablet (5 mg total) by mouth 3 (three) times daily as needed for muscle spasms. Patient not taking: Reported on 05/22/2016 02/16/16   Willy Eddy, MD  dicyclomine (BENTYL) 20 MG tablet Take 1 tablet (20 mg total) by mouth 3 (three) times daily as needed for spasms. 10/06/16   Sharman Cheek, MD   dicyclomine (BENTYL) 20 MG tablet Take 1 tablet (20 mg total) by mouth 3 (three) times daily as needed for spasms. 12/07/16 02/05/17  Menshew, Charlesetta Ivory, PA-C  ibuprofen (ADVIL,MOTRIN) 800 MG tablet Take 800 mg by mouth 2 (two) times daily.    [provider]  lisinopril (PRINIVIL,ZESTRIL) 20 MG tablet Take 1 tablet (20 mg total) by mouth daily. 10/06/16 10/06/17  Sharman Cheek, MD  lisinopril (PRINIVIL,ZESTRIL) 20 MG tablet Take 1 tablet (20 mg total) by mouth daily. 12/07/16 03/07/17  Menshew, Charlesetta Ivory, PA-C  metoprolol succinate (TOPROL XL) 50 MG 24 hr tablet Take 1 tablet (50 mg total) by mouth daily. Take with or immediately following a meal. 10/06/16 10/06/17  Sharman Cheek, MD  metoprolol succinate (TOPROL XL) 50 MG 24 hr tablet Take 1 tablet (50 mg total) by mouth daily. Take with or immediately following a meal. 12/07/16 03/07/17  Menshew, Charlesetta Ivory, PA-C  omeprazole (PRILOSEC) 20 MG capsule Take 1 capsule (20 mg total) by mouth daily. 12/07/16 02/05/17  Menshew, Charlesetta Ivory, PA-C  ranitidine (ZANTAC) 150 MG tablet Take 1 tablet (150 mg total) by mouth 2 (two) times daily. 02/16/16 02/15/17  Willy Eddy, MD    Allergies Penicillins; Compazine [prochlorperazine edisylate]; Demerol [meperidine]; Reglan [metoclopramide]; and Toradol [ketorolac tromethamine]  No family history on file.  Social History Social History  Substance Use Topics  . Smoking status: Never Smoker  . Smokeless tobacco: Never Used  . Alcohol  use No    Review of Systems  Constitutional: No fever/chills. Eyes: No visual changes. ENT: No sore throat. Cardiovascular: Denies chest pain. Respiratory: Denies shortness of breath. Gastrointestinal: Positive for right flank pain. No abdominal pain.  No nausea, no vomiting.  No diarrhea.  No constipation. Genitourinary: Negative for dysuria. Musculoskeletal: Negative for back pain. Skin: Negative for rash. Neurological: Negative for headaches,  focal weakness or numbness.   ____________________________________________   PHYSICAL EXAM:  VITAL SIGNS: ED Triage Vitals  Enc Vitals Group     BP 01/11/17 0342 (!) 148/98     Pulse Rate 01/11/17 0342 (!) 102     Resp 01/11/17 0342 18     Temp 01/11/17 0342 98.7 F (37.1 C)     Temp Source 01/11/17 0342 Oral     SpO2 01/11/17 0342 97 %     Weight 01/11/17 0329 204 lb (92.5 kg)     Height 01/11/17 0329 5\' 4"  (1.626 m)     Head Circumference --      Peak Flow --      Pain Score 01/11/17 0328 7     Pain Loc --      Pain Edu? --      Excl. in GC? --     Constitutional: Alert and oriented. Well appearing and in mild acute distress. Eyes: Conjunctivae are normal. PERRL. EOMI. Head: Atraumatic. Nose: No congestion/rhinnorhea. Mouth/Throat: Mucous membranes are moist.  Oropharynx non-erythematous. Neck: No stridor.   Cardiovascular: Normal rate, regular rhythm. Grossly normal heart sounds.  Good peripheral circulation. Respiratory: Normal respiratory effort.  No retractions. Lungs CTAB. Gastrointestinal: Soft and nontender. No distention. No abdominal bruits. Mild right CVA tenderness. Musculoskeletal: No lower extremity tenderness nor edema.  No joint effusions. Neurologic:  Normal speech and language. No gross focal neurologic deficits are appreciated. No gait instability. Skin:  Skin is warm, dry and intact. No rash noted. Psychiatric: Mood and affect are normal. Speech and behavior are normal.  ____________________________________________   LABS (all labs ordered are listed, but only abnormal results are displayed)  Labs Reviewed  URINALYSIS, ROUTINE W REFLEX MICROSCOPIC - Abnormal; Notable for the following:       Result Value   Color, Urine STRAW (*)    APPearance CLEAR (*)    All other components within normal limits  BASIC METABOLIC PANEL - Abnormal; Notable for the following:    Glucose, Bld 134 (*)    All other components within normal limits  CBC WITH  DIFFERENTIAL/PLATELET  CBC WITH DIFFERENTIAL/PLATELET   ____________________________________________  EKG  None ____________________________________________  RADIOLOGY  Ct Renal Stone Study  Result Date: 01/11/2017 CLINICAL DATA:  Acute onset of right lower back pain and dysuria. Initial encounter. EXAM: CT ABDOMEN AND PELVIS WITHOUT CONTRAST TECHNIQUE: Multidetector CT imaging of the abdomen and pelvis was performed following the standard protocol without IV contrast. COMPARISON:  None. FINDINGS: Lower chest: The visualized lung bases are grossly clear. The visualized portions of the mediastinum are unremarkable. Hepatobiliary: There is diffuse fatty infiltration within the liver. The gallbladder is unremarkable in appearance. The common bile duct remains normal in caliber. Pancreas: The pancreas is within normal limits. Spleen: The spleen is unremarkable in appearance. Adrenals/Urinary Tract: The adrenal glands are unremarkable in appearance. Nonobstructing bilateral renal stones measure up to 3 mm in size. There is no evidence of hydronephrosis. No obstructing ureteral stones are identified. No perinephric stranding is appreciated. Stomach/Bowel: The stomach is unremarkable in appearance. The small bowel is within normal limits.  The appendix is not visualized; there is no evidence for appendicitis. The colon is unremarkable in appearance. Vascular/Lymphatic: The abdominal aorta is unremarkable in appearance. The inferior vena cava is grossly unremarkable. No retroperitoneal lymphadenopathy is seen. No pelvic sidewall lymphadenopathy is identified. Reproductive: The bladder is mildly distended and grossly unremarkable. The patient is status post hysterectomy. No suspicious adnexal masses are seen. Other: No additional soft tissue abnormalities are seen. Musculoskeletal: No acute osseous abnormalities are identified. The visualized musculature is unremarkable in appearance. IMPRESSION: 1. No acute  abnormality seen within the abdomen or pelvis. 2. Nonobstructing bilateral renal stones measure up to 3 mm in size. 3. Diffuse fatty infiltration within the liver. Electronically Signed   By: Roanna RaiderJeffery  Chang M.D.   On: 01/11/2017 06:08    ____________________________________________   PROCEDURES  Procedure(s) performed: None  Procedures  Critical Care performed: No  ____________________________________________   INITIAL IMPRESSION / ASSESSMENT AND PLAN / ED COURSE  Pertinent labs & imaging results that were available during my care of the patient were reviewed by me and considered in my medical decision making (see chart for details).  41 year old female with history of kidney stones who presents with right flank pain. Will obtain screening lab work, urinalysis; start IV fluid resuscitation, analgesia and reassess.  Clinical Course as of Jan 11 653  Wynelle LinkSun Jan 11, 2017  [id number] Trouble obtaining IV access after losing initial IV. Pain returned. Will administer IM Dilaudid with Phenergan.  [JS]  [phone] Patient was able to receive Dilaudid and Phenergan through the second IV. Pain improved although patient still uncomfortable. Updated her of laboratory urinalysis and CT imaging results. States she feels very similarly when she had kidney stones. Will discharge home with Percocet, Phenergan and she will follow-up with her PCP closely. Strict return precautions given. Patient verbalizes understanding and agrees with plan of care.  [JS]    Clinical Course User Index [JS] Irean HongSung, Wilhemina Grall J, MD     ____________________________________________   FINAL CLINICAL IMPRESSION(S) / ED DIAGNOSES  Final diagnoses:  Flank pain      NEW MEDICATIONS STARTED DURING THIS VISIT:  New Prescriptions   No medications on file     Note:  This document was prepared using Dragon voice recognition software and may include unintentional dictation errors.    Irean HongSung, Derick Seminara J, MD 01/11/17 [phone]

## 2017-01-11 NOTE — Discharge Instructions (Signed)
1. Take pain and nausea medicines as needed (Percocet/Phenergan). 2. Drink plenty of bottled or filtered water daily. 3. Return to the ER for worsening symptoms, persistent vomiting, difficulty breathing or other concerns.

## 2017-01-11 NOTE — ED Notes (Signed)
Call placed to lab for them to do blood draw.

## 2017-01-11 NOTE — ED Triage Notes (Addendum)
Pt reports right lower back pain and dysuria since yesterday; pain improves after voiding and when she lays on affected side; pt with history of kidney stones and says this feels the same; nausea, no vomiting; ambulatory with steady gait;

## 2017-03-06 ENCOUNTER — Emergency Department: Payer: Self-pay

## 2017-03-06 ENCOUNTER — Encounter: Payer: Self-pay | Admitting: Emergency Medicine

## 2017-03-06 ENCOUNTER — Emergency Department
Admission: EM | Admit: 2017-03-06 | Discharge: 2017-03-06 | Disposition: A | Discharge status: Home / Self Care | Payer: Self-pay | Attending: Emergency Medicine | Admitting: Emergency Medicine

## 2017-03-06 DIAGNOSIS — I10 Essential (primary) hypertension: Secondary | ICD-10-CM | POA: Insufficient documentation

## 2017-03-06 DIAGNOSIS — N201 Calculus of ureter: Secondary | ICD-10-CM | POA: Insufficient documentation

## 2017-03-06 DIAGNOSIS — Z79899 Other long term (current) drug therapy: Secondary | ICD-10-CM | POA: Insufficient documentation

## 2017-03-06 LAB — CBC
HEMATOCRIT: 43.7 % (ref 35.0–47.0)
HEMOGLOBIN: 15.5 g/dL (ref 12.0–16.0)
MCH: 32.5 pg (ref 26.0–34.0)
MCHC: 35.5 g/dL (ref 32.0–36.0)
MCV: 91.6 fL (ref 80.0–100.0)
Platelets: 258 10*3/uL (ref 150–440)
RBC: 4.77 MIL/uL (ref 3.80–5.20)
RDW: 13 % (ref 11.5–14.5)
WBC: 8.6 10*3/uL (ref 3.6–11.0)

## 2017-03-06 LAB — URINALYSIS, COMPLETE (UACMP) WITH MICROSCOPIC
Bilirubin Urine: NEGATIVE
Glucose, UA: NEGATIVE mg/dL
Hgb urine dipstick: NEGATIVE
Ketones, ur: NEGATIVE mg/dL
LEUKOCYTES UA: NEGATIVE
Nitrite: NEGATIVE
PH: 6 (ref 5.0–8.0)
Protein, ur: NEGATIVE mg/dL
SPECIFIC GRAVITY, URINE: 1.006 (ref 1.005–1.030)

## 2017-03-06 LAB — BASIC METABOLIC PANEL
Anion gap: 8 (ref 5–15)
BUN: 14 mg/dL (ref 6–20)
CHLORIDE: 105 mmol/L (ref 101–111)
CO2: 29 mmol/L (ref 22–32)
Calcium: 10.5 mg/dL — ABNORMAL HIGH (ref 8.9–10.3)
Creatinine, Ser: 0.99 mg/dL (ref 0.44–1.00)
GFR calc Af Amer: >60 mL/min (ref >60)
GFR calc non Af Amer: >60 mL/min (ref >60)
GLUCOSE: 150 mg/dL — AB (ref 65–99)
POTASSIUM: 4.1 mmol/L (ref 3.5–5.1)
Sodium: 142 mmol/L (ref 135–145)

## 2017-03-06 LAB — PREGNANCY, URINE: Preg Test, Ur: NEGATIVE

## 2017-03-06 MED ORDER — TAMSULOSIN HCL 0.4 MG PO CAPS: 0.4000 mg | ORAL_CAPSULE | Freq: Every day | ORAL | 0 refills | Status: DC

## 2017-03-06 MED ORDER — MORPHINE SULFATE (PF) 4 MG/ML IV SOLN
INTRAVENOUS | Status: AC
  Administered 2017-03-06: 4 mg via INTRAVENOUS
  Filled 2017-03-06: qty 1

## 2017-03-06 MED ORDER — MORPHINE SULFATE (PF) 4 MG/ML IV SOLN
4.0000 mg | Freq: Once | INTRAVENOUS | Status: AC
  Administered 2017-03-06: 4 mg via INTRAVENOUS

## 2017-03-06 MED ORDER — TAMSULOSIN HCL 0.4 MG PO CAPS
ORAL_CAPSULE | ORAL | Status: AC
  Filled 2017-03-06: qty 1

## 2017-03-06 MED ORDER — SODIUM CHLORIDE 0.9 % IV BOLUS (SEPSIS)
1000.0000 mL | Freq: Once | INTRAVENOUS | Status: AC
  Administered 2017-03-06: 1000 mL via INTRAVENOUS

## 2017-03-06 MED ORDER — ONDANSETRON HCL 4 MG/2ML IJ SOLN: 4.0000 mg | Freq: Once | INTRAMUSCULAR | Status: DC

## 2017-03-06 MED ORDER — TAMSULOSIN HCL 0.4 MG PO CAPS
0.4000 mg | ORAL_CAPSULE | Freq: Once | ORAL | Status: AC
  Administered 2017-03-06: 0.4 mg via ORAL

## 2017-03-06 MED ORDER — PROMETHAZINE HCL 25 MG PO TABS: 25.0000 mg | ORAL_TABLET | Freq: Four times a day (QID) | ORAL | 0 refills | Status: DC | PRN

## 2017-03-06 MED ORDER — PROMETHAZINE HCL 25 MG/ML IJ SOLN
25.0000 mg | Freq: Once | INTRAMUSCULAR | Status: AC
  Administered 2017-03-06: 25 mg via INTRAVENOUS
  Filled 2017-03-06: qty 1

## 2017-03-06 MED ORDER — OXYCODONE-ACETAMINOPHEN 5-325 MG PO TABS
1.0000 | ORAL_TABLET | Freq: Four times a day (QID) | ORAL | 0 refills | Status: DC | PRN
Start: 2017-03-06 — End: 2018-09-23

## 2017-03-06 MED ORDER — ONDANSETRON HCL 4 MG/2ML IJ SOLN
INTRAMUSCULAR | Status: AC
  Filled 2017-03-06: qty 2

## 2017-03-06 NOTE — ED Notes (Signed)
MD at bedside. 

## 2017-03-06 NOTE — ED Notes (Signed)
Patient transported to CT 

## 2017-03-06 NOTE — ED Provider Notes (Signed)
[facility] Emergency Department Provider Note  Time seen: 5:08 AM  I have reviewed the triage vital signs and the nursing notes.   HISTORY  Chief Complaint Flank Pain    HPI Tina Brennan is a 41 y.o. female With a past medical history of hypertension, IBS, pancreatitis, presents to the emergency department for right flank pain. According to the patient she was awoken this evening with pain in her right flank and the feeling like she needed to urinate but only very little amount of urine came out. Denies any hematuria. Patient states a significant history of kidney stones to which this feels similar. Denies any fever. States nausea but denies vomiting. Denies diarrhea.  Past Medical History:  Diagnosis Date  . Hypertension   . IBS (irritable bowel syndrome)   . Pancreatitis     There are no active problems to display for this patient.   Past Surgical History:  Procedure Laterality Date  . ABDOMINAL HYSTERECTOMY    . BLADDER SURGERY    . cyst removal of ovaries      Prior to Admission medications   Medication Sig Start Date End Date Taking? Authorizing Provider  amLODipine (NORVASC) 5 MG tablet Take 1 tablet (5 mg total) by mouth daily. 12/07/16 03/07/17  Menshew, Charlesetta Ivory, PA-C  ciprofloxacin (CIPRO) 500 MG tablet Take 1 tablet (500 mg total) by mouth 2 (two) times daily. 05/22/16   Jene Every, MD  cloNIDine (CATAPRES) 0.1 MG tablet Take 1 tablet (0.1 mg total) by mouth 2 (two) times daily. 05/22/16 05/22/17  Jene Every, MD  cyclobenzaprine (FLEXERIL) 5 MG tablet Take 1 tablet (5 mg total) by mouth 3 (three) times daily as needed for muscle spasms. Patient not taking: Reported on 05/22/2016 02/16/16   Willy Eddy, MD  dicyclomine (BENTYL) 20 MG tablet Take 1 tablet (20 mg total) by mouth 3 (three) times daily as needed for spasms. 10/06/16   Sharman Cheek, MD  dicyclomine (BENTYL) 20 MG tablet Take 1 tablet (20 mg total) by mouth  3 (three) times daily as needed for spasms. 12/07/16 02/05/17  Menshew, Charlesetta Ivory, PA-C  ibuprofen (ADVIL,MOTRIN) 800 MG tablet Take 800 mg by mouth 2 (two) times daily.    [provider]  lisinopril (PRINIVIL,ZESTRIL) 20 MG tablet Take 1 tablet (20 mg total) by mouth daily. 10/06/16 10/06/17  Sharman Cheek, MD  lisinopril (PRINIVIL,ZESTRIL) 20 MG tablet Take 1 tablet (20 mg total) by mouth daily. 12/07/16 03/07/17  Menshew, Charlesetta Ivory, PA-C  metoprolol succinate (TOPROL XL) 50 MG 24 hr tablet Take 1 tablet (50 mg total) by mouth daily. Take with or immediately following a meal. 10/06/16 10/06/17  Sharman Cheek, MD  metoprolol succinate (TOPROL XL) 50 MG 24 hr tablet Take 1 tablet (50 mg total) by mouth daily. Take with or immediately following a meal. 12/07/16 03/07/17  Menshew, Charlesetta Ivory, PA-C  omeprazole (PRILOSEC) 20 MG capsule Take 1 capsule (20 mg total) by mouth daily. 12/07/16 02/05/17  Menshew, Charlesetta Ivory, PA-C  oxyCODONE-acetaminophen (PERCOCET) 10-325 MG tablet Take 1 tablet by mouth every 6 (six) hours as needed for pain. 01/11/17   Irean Hong, MD  promethazine (PHENERGAN) 25 MG tablet Take 1 tablet (25 mg total) by mouth every 6 (six) hours as needed for nausea or vomiting. 01/11/17   Irean Hong, MD  ranitidine (ZANTAC) 150 MG tablet Take 1 tablet (150 mg total) by mouth 2 (two) times daily. 02/16/16 02/15/17  Roxan Hockey,  Luisa Hart, MD    Allergies  Allergen Reactions  . Penicillins Anaphylaxis  . Compazine [Prochlorperazine Edisylate] Other (See Comments)    Dystonia   . Demerol [Meperidine] Hives and Other (See Comments)    Dystonia and Fever Blisters.   . Reglan [Metoclopramide] Other (See Comments)    Dystonia   . Toradol [Ketorolac Tromethamine] Other (See Comments)    Dystonia   . Zofran [Ondansetron Hcl]     Dystonia     History reviewed. No pertinent family history.  Social History Social History  Substance Use Topics  . Smoking status: Never Smoker   . Smokeless tobacco: Never Used  . Alcohol use No    Review of Systems Constitutional: Negative for fever. Cardiovascular: Negative for chest pain. Respiratory: Negative for shortness of breath. Gastrointestinal: right sided abdominal pain Musculoskeletal: right flank pain Neurological: Negative for headache All other ROS negative  ____________________________________________   PHYSICAL EXAM:  Constitutional: Alert and oriented. mild distress due to pain Eyes: Normal exam ENT   Head: Normocephalic and atraumatic   Mouth/Throat: Mucous membranes are moist. Cardiovascular: Normal rate, regular rhythm. No murmur Respiratory: Normal respiratory effort without tachypnea nor retractions. Breath sounds are clear Gastrointestinal: soft, mild suprapubic tenderness palpation. No CVA tenderness. Musculoskeletal: Nontender with normal range of motion in all extremities.  Neurologic:  Normal speech and language. No gross focal neurologic deficits  Skin:  Skin is warm, dry and intact.  Psychiatric: Mood and affect are normal.   ____________________________________________     RADIOLOGY  3.5 mm right ureteral stone  ____________________________________________   INITIAL IMPRESSION / ASSESSMENT AND PLAN / ED COURSE  Pertinent labs & imaging results that were available during my care of the patient were reviewed by me and considered in my medical decision making (see chart for details).  patient presents to the emergency department for right flank pain starting approximately 2 hours ago. Differential this time would include urinary tract infection, pyelonephritis, ureterolithiasis, other intra-abdominal pathology. We will check labs, CT renal scan and continue to closely monitor the patient.  I have reviewed the patient's records, she had a CT scan performed 01/11/17 showing renal stones but no ureteral stones at that time.  CT consistent with 3.5 mm right distal ureteral  stone. Patient's pain is improved. We will discharge with pain medication and Flomax. Patient to follow-up with urology.   ____________________________________________   FINAL CLINICAL IMPRESSION(S) / ED DIAGNOSES  right flank pain kidney stone   Minna Antis, MD 03/06/17 [phone]

## 2017-03-06 NOTE — ED Notes (Signed)
Reviewed d/c instructions, follow-up care, prescriptions, need to increase fluids with patient. Pt verbalized understanding. Reviewed that patient can not drive and take narcotic pain medication. Pt verbalized medication. Patient out to lobby to wait for ride.

## 2017-03-06 NOTE — ED Triage Notes (Signed)
Pt c/o right lower back pain that radiates into right flank area. Pt having nausea, no emesis.

## 2017-04-22 LAB — COMP. METABOLIC PANEL (12)
ALT: 37 — AB (ref 3–30)
AST: 29
Albumin/Globulin Ratio: 1.4
Albumin: 4.2
Alkaline Phosphatase: 108
BUN/Creatinine Ratio: 21
BUN: 17 (ref 4–21)
Calcium: 10.1
Carbon Dioxide, Total: 25
Chloride: 104
Creat: 0.8
EGFR (African American): 104
EGFR (Non-African Amer.): 91
Globulin, Total: 3
Glucose: 73
Potassium: 4.5
Sodium: 142
Total Bilirubin: 0
Total Protein: 7.2 g/dL

## 2017-04-22 LAB — LIPID PANEL
Cholesterol, Total: 235
HDL Cholesterol: 34 — AB (ref 35–70)
LDL Cholesterol (Calc): 152
LDl/HDL Ratio: 4.5
Triglycerides: 243 — AB (ref 40–160)
VLDL Cholesterol Cal: 49

## 2017-04-22 LAB — TSH: TSH: 1.46

## 2017-08-12 LAB — LIPID PANEL
Cholesterol, Total: 246
HDL Cholesterol: 39 (ref 35–70)
LDL Cholesterol (Calc): 169
LDl/HDL Ratio: 4.3
Triglycerides: 189 — AB (ref 40–160)
VLDL Cholesterol Cal: 38

## 2017-08-12 LAB — COMP. METABOLIC PANEL (12)
ALT: 34 — AB (ref 3–30)
AST: 34
Albumin/Globulin Ratio: 1.6
Albumin: 4.5
Alkaline Phosphatase: 105
BUN/Creatinine Ratio: 14
BUN: 11 (ref 4–21)
Calcium: 9.9
Carbon Dioxide, Total: 22
Chloride: 103
EGFR (African American): 104
GFR CALC NON AF AMER: 90
GLUCOSE: 90
Globulin, Total: 2.9
POTASSIUM: 4.3
Sodium: 140
Total Bilirubin: 0.3
Total Protein: 7.4 g/dL

## 2017-08-12 LAB — TSH: TSH: 2.44

## 2017-08-25 ENCOUNTER — Encounter: Payer: Self-pay | Admitting: Emergency Medicine

## 2017-08-25 ENCOUNTER — Other Ambulatory Visit: Payer: Self-pay

## 2017-08-25 ENCOUNTER — Emergency Department
Admission: EM | Admit: 2017-08-25 | Discharge: 2017-08-25 | Disposition: A | Discharge status: Home / Self Care | Payer: Self-pay | Attending: Emergency Medicine | Admitting: Emergency Medicine

## 2017-08-25 DIAGNOSIS — K222 Esophageal obstruction: Secondary | ICD-10-CM | POA: Insufficient documentation

## 2017-08-25 DIAGNOSIS — Z79899 Other long term (current) drug therapy: Secondary | ICD-10-CM | POA: Insufficient documentation

## 2017-08-25 DIAGNOSIS — I10 Essential (primary) hypertension: Secondary | ICD-10-CM | POA: Insufficient documentation

## 2017-08-25 LAB — CBC
HCT: 42 % (ref 35.0–47.0)
HEMOGLOBIN: 14.7 g/dL (ref 12.0–16.0)
MCH: 32.4 pg (ref 26.0–34.0)
MCHC: 34.9 g/dL (ref 32.0–36.0)
MCV: 92.9 fL (ref 80.0–100.0)
PLATELETS: 250 10*3/uL (ref 150–440)
RBC: 4.52 MIL/uL (ref 3.80–5.20)
RDW: 13.1 % (ref 11.5–14.5)
WBC: 7.7 10*3/uL (ref 3.6–11.0)

## 2017-08-25 LAB — BASIC METABOLIC PANEL
Anion gap: 10 (ref 5–15)
BUN: 9 mg/dL (ref 6–20)
CALCIUM: 9.9 mg/dL (ref 8.9–10.3)
CHLORIDE: 101 mmol/L (ref 101–111)
CO2: 27 mmol/L (ref 22–32)
CREATININE: 0.68 mg/dL (ref 0.44–1.00)
Glucose, Bld: 124 mg/dL — ABNORMAL HIGH (ref 65–99)
Potassium: 4 mmol/L (ref 3.5–5.1)
SODIUM: 138 mmol/L (ref 135–145)

## 2017-08-25 NOTE — ED Triage Notes (Signed)
Pt in via POV; reports worsening dysphagia x months, unable to get down solid foods.  Pt able to speak in full, clear sentences, NAD noted at this time.

## 2017-08-25 NOTE — ED Notes (Signed)
Pt ambulatory to POV without difficulty. VSS. NAD. Discharge instructions and follow up discussed. All questions answered.

## 2017-08-25 NOTE — ED Notes (Signed)
Pt presentation discussed with EDP; see new orders. 

## 2017-08-27 NOTE — ED Provider Notes (Signed)
[facility] Emergency Department Provider Note   ____________________________________________    I have reviewed the triage vital signs and the nursing notes.   HISTORY  Chief Complaint Dysphagia     HPI Tina Brennan is a 42 y.o. female who presents with complaints of difficulty swallowing solids in particular.  Patient reports this is been ongoing for nearly a year but seems to have worsened over the last several months.  She notes that solids seem to get stuck midway down her throat and either she has to drink a lot of water to get it through or she has to vomit the food back up.  She has not seen anyone for this nor taken anything for this.  No history of esophageal procedures.  No neck swelling or difficulty breathing.   Past Medical History:  Diagnosis Date  . Hypertension   . IBS (irritable bowel syndrome)   . Pancreatitis     There are no active problems to display for this patient.   Past Surgical History:  Procedure Laterality Date  . ABDOMINAL HYSTERECTOMY    . BLADDER SURGERY    . cyst removal of ovaries      Prior to Admission medications   Medication Sig Start Date End Date Taking? Authorizing Provider  amLODipine (NORVASC) 5 MG tablet Take 1 tablet (5 mg total) by mouth daily. 12/07/16 03/07/17  Menshew, Charlesetta Ivory, PA-C  ciprofloxacin (CIPRO) 500 MG tablet Take 1 tablet (500 mg total) by mouth 2 (two) times daily. 05/22/16   Jene Every, MD  cloNIDine (CATAPRES) 0.1 MG tablet Take 1 tablet (0.1 mg total) by mouth 2 (two) times daily. 05/22/16 05/22/17  Jene Every, MD  cyclobenzaprine (FLEXERIL) 5 MG tablet Take 1 tablet (5 mg total) by mouth 3 (three) times daily as needed for muscle spasms. Patient not taking: Reported on 05/22/2016 02/16/16   Willy Eddy, MD  dicyclomine (BENTYL) 20 MG tablet Take 1 tablet (20 mg total) by mouth 3 (three) times daily as needed for spasms. 10/06/16   Sharman Cheek, MD    dicyclomine (BENTYL) 20 MG tablet Take 1 tablet (20 mg total) by mouth 3 (three) times daily as needed for spasms. 12/07/16 02/05/17  Menshew, Charlesetta Ivory, PA-C  ibuprofen (ADVIL,MOTRIN) 800 MG tablet Take 800 mg by mouth 2 (two) times daily.    [provider]  lisinopril (PRINIVIL,ZESTRIL) 20 MG tablet Take 1 tablet (20 mg total) by mouth daily. 10/06/16 10/06/17  Sharman Cheek, MD  lisinopril (PRINIVIL,ZESTRIL) 20 MG tablet Take 1 tablet (20 mg total) by mouth daily. 12/07/16 03/07/17  Menshew, Charlesetta Ivory, PA-C  metoprolol succinate (TOPROL XL) 50 MG 24 hr tablet Take 1 tablet (50 mg total) by mouth daily. Take with or immediately following a meal. 10/06/16 10/06/17  Sharman Cheek, MD  metoprolol succinate (TOPROL XL) 50 MG 24 hr tablet Take 1 tablet (50 mg total) by mouth daily. Take with or immediately following a meal. 12/07/16 03/07/17  Menshew, Charlesetta Ivory, PA-C  omeprazole (PRILOSEC) 20 MG capsule Take 1 capsule (20 mg total) by mouth daily. 12/07/16 02/05/17  Menshew, Charlesetta Ivory, PA-C  oxyCODONE-acetaminophen (ROXICET) 5-325 MG tablet Take 1 tablet by mouth every 6 (six) hours as needed. 03/06/17   Minna Antis, MD  promethazine (PHENERGAN) 25 MG tablet Take 1 tablet (25 mg total) by mouth every 6 (six) hours as needed for nausea or vomiting. 03/06/17   Minna Antis, MD  ranitidine (ZANTAC) 150 MG  tablet Take 1 tablet (150 mg total) by mouth 2 (two) times daily. 02/16/16 02/15/17  Willy Eddyobinson, Patrick, MD  tamsulosin (FLOMAX) 0.4 MG CAPS capsule Take 1 capsule (0.4 mg total) by mouth daily. 03/06/17   Minna AntisPaduchowski, Kevin, MD     Allergies Penicillins; Compazine [prochlorperazine edisylate]; Demerol [meperidine]; Reglan [metoclopramide]; Toradol [ketorolac tromethamine]; and Zofran [ondansetron hcl]  No family history on file.  Social History Social History   Tobacco Use  . Smoking status: Never Smoker  . Smokeless tobacco: Never Used  Substance Use Topics  . Alcohol  use: No  . Drug use: Yes    Types: Marijuana    Review of Systems  Constitutional: No weight loss Eyes: No visual changes.  ENT: As above Cardiovascular: Denies chest pain. Respiratory: No difficulty breathing Gastrointestinal: No abdominal pain Genitourinary: Negative for dysuria. Musculoskeletal: Negative for back pain. Skin: Negative for rash. Neurological: Negative for headaches   ____________________________________________   PHYSICAL EXAM:  VITAL SIGNS: ED Triage Vitals  Enc Vitals Group     BP 08/25/17 1656 (!) 191/124     Pulse Rate 08/25/17 1656 80     Resp 08/25/17 1656 16     Temp 08/25/17 1656 98.7 F (37.1 C)     Temp Source 08/25/17 1656 Oral     SpO2 08/25/17 1656 97 %     Weight 08/25/17 1657 90.7 kg (200 lb)     Height 08/25/17 1657 1.626 m (5\' 4" )     Head Circumference --      Peak Flow --      Pain Score 08/25/17 1657 0     Pain Loc --      Pain Edu? --      Excl. in GC? --     Constitutional: Alert and oriented. No acute distress. Pleasant and interactive Eyes: Conjunctivae are normal.   Nose: No congestion/rhinnorhea. Mouth/Throat: Mucous membranes are moist.  Pharynx normal, uvula normal Neck:  Painless ROM, no stridor Cardiovascular: Normal rate, regular rhythm.  Good peripheral circulation. Respiratory: Normal respiratory effort.  No retractions. Lungs CTAB. Gastrointestinal: Soft and nontender. No distention.    Musculoskeletal:  Warm and well perfused Neurologic:  Normal speech and language. No gross focal neurologic deficits are appreciated.  Skin:  Skin is warm, dry and intact. No rash noted. Psychiatric: Mood and affect are normal. Speech and behavior are normal.  ____________________________________________   LABS (all labs ordered are listed, but only abnormal results are displayed)  Labs Reviewed  BASIC METABOLIC PANEL - Abnormal; Notable for the following components:      Result Value   Glucose, Bld 124 (*)    All  other components within normal limits  CBC   ____________________________________________  EKG  None ____________________________________________  RADIOLOGY  None ____________________________________________   PROCEDURES  Procedure(s) performed: No  Procedures   Critical Care performed: No ____________________________________________   INITIAL IMPRESSION / ASSESSMENT AND PLAN / ED COURSE  Pertinent labs & imaging results that were available during my care of the patient were reviewed by me and considered in my medical decision making (see chart for details).  HPI most consistent with likely esophageal stricture, discussed this at length with patient and family and detailed the need for follow-up with GI, referral arranged.  Soft diet recommended    ____________________________________________   FINAL CLINICAL IMPRESSION(S) / ED DIAGNOSES  Final diagnoses:  Esophageal stricture        Note:  This document was prepared using Dragon voice recognition software and may  include unintentional dictation errors.    Jene Every, MD 08/27/17 [phone]

## 2017-08-28 ENCOUNTER — Telehealth: Payer: Self-pay | Admitting: Gastroenterology

## 2017-08-28 NOTE — Telephone Encounter (Signed)
Left vm for pt to offer April 2nd apt at 1:45

## 2017-09-01 ENCOUNTER — Other Ambulatory Visit: Payer: Self-pay

## 2017-09-01 ENCOUNTER — Ambulatory Visit (INDEPENDENT_AMBULATORY_CARE_PROVIDER_SITE_OTHER): Payer: Self-pay | Admitting: Gastroenterology

## 2017-09-01 ENCOUNTER — Encounter: Payer: Self-pay | Admitting: Gastroenterology

## 2017-09-01 VITALS — BP 164/125 | HR 88 | Temp 98.1°F | Ht 64.0 in | Wt 213.4 lb

## 2017-09-01 DIAGNOSIS — K219 Gastro-esophageal reflux disease without esophagitis: Secondary | ICD-10-CM

## 2017-09-01 MED ORDER — OMEPRAZOLE 20 MG PO CPDR: 20.0000 mg | DELAYED_RELEASE_CAPSULE | Freq: Every day | ORAL | 1 refills | Status: DC

## 2017-09-01 NOTE — Patient Instructions (Signed)
Pt will contact office to schedule EGD. She does not have any insurance. Unsure of when this will be, Dr. Maximino Greenlandahiliani wants pt to take Prilosec 20mg  daily and f/u 1 month. If able to schedule EGD within the next week or two to f/u in 2 months.

## 2017-09-01 NOTE — Progress Notes (Addendum)
Tina Brennan [address]  [address]  [place], [place] [address]  Main: [phone]  Fax: [phone]   Gastroenterology Consultation  Referring Provider:     [facility] Primary Care Physician:  [facility], [facility] Primary Gastroenterologist:  Dr. Melodie Brennan Reason for Consultation:    Dysphagia, GERD        HPI:   Tina Brennan is a 42 y.o. y/o female referred for consultation & management  by Dr. Eli Phillips, [facility].  Patient reports one year history of intermittent dysphagia to solids.  Occurs 2-3 times a day.  Has worsened over the last 2 months.  States has to go to the bathroom, and use a brush to get herself to get the food out at times.  Feels like it is stuck in her throat.  No ER admissions for food impaction.  However, went to the ER on March 26, and due to no alarm symptoms, was asked to follow-up with GI.  Also reports daily heartburn, 2-3 times a day. Is not on any PPI.  No altered bowel habits.  No melena or hematochezia.  No nausea or vomiting or hematemesis. Moved from [place] 2 years ago.  She did not have the symptoms in [place].  Patient reports having colonoscopy and EGD in [place], however, states they were normal.  States they were done for abdominal pain at that time.  Past Medical History:  Diagnosis Date  . Hypertension   . IBS (irritable bowel syndrome)   . Pancreatitis     Past Surgical History:  Procedure Laterality Date  . ABDOMINAL HYSTERECTOMY    . BLADDER SURGERY    . cyst removal of ovaries      Prior to Admission medications   Medication Sig Start Date End Date Taking? Authorizing Provider  dicyclomine (BENTYL) 20 MG tablet Take 1 tablet (20 mg total) by mouth 3 (three) times daily as needed for spasms. 10/06/16  Yes Sharman Cheek, MD  ibuprofen (ADVIL,MOTRIN) 800 MG tablet Take 800 mg by mouth 2 (two) times daily.   Yes [provider]  lisinopril (PRINIVIL,ZESTRIL)  20 MG tablet Take 1 tablet (20 mg total) by mouth daily. 10/06/16 10/06/17 Yes Sharman Cheek, MD  metoprolol succinate (TOPROL XL) 50 MG 24 hr tablet Take 1 tablet (50 mg total) by mouth daily. Take with or immediately following a meal. 10/06/16 10/06/17 Yes Sharman Cheek, MD  amLODipine (NORVASC) 5 MG tablet Take 1 tablet (5 mg total) by mouth daily. 12/07/16 03/07/17  Menshew, Charlesetta Ivory, PA-C  ciprofloxacin (CIPRO) 500 MG tablet Take 1 tablet (500 mg total) by mouth 2 (two) times daily. Patient not taking: Reported on 09/01/2017 05/22/16   Jene Every, MD  cloNIDine (CATAPRES) 0.1 MG tablet Take 1 tablet (0.1 mg total) by mouth 2 (two) times daily. 05/22/16 05/22/17  Jene Every, MD  cyclobenzaprine (FLEXERIL) 5 MG tablet Take 1 tablet (5 mg total) by mouth 3 (three) times daily as needed for muscle spasms. Patient not taking: Reported on 05/22/2016 02/16/16   Willy Eddy, MD  dicyclomine (BENTYL) 20 MG tablet Take 1 tablet (20 mg total) by mouth 3 (three) times daily as needed for spasms. 12/07/16 02/05/17  Menshew, Charlesetta Ivory, PA-C  lisinopril (PRINIVIL,ZESTRIL) 20 MG tablet Take 1 tablet (20 mg total) by mouth daily. 12/07/16 03/07/17  Menshew, Charlesetta Ivory, PA-C  metoprolol succinate (TOPROL XL) 50 MG 24 hr tablet Take 1 tablet (50 mg total) by mouth daily. Take with  or immediately following a meal. 12/07/16 03/07/17  Menshew, Charlesetta Ivory, PA-C  omeprazole (PRILOSEC) 20 MG capsule Take 1 capsule (20 mg total) by mouth daily. 09/01/17   Pasty Spillers, MD  oxyCODONE-acetaminophen (ROXICET) 5-325 MG tablet Take 1 tablet by mouth every 6 (six) hours as needed. Patient not taking: Reported on 09/01/2017 03/06/17   Minna Antis, MD  promethazine (PHENERGAN) 25 MG tablet Take 1 tablet (25 mg total) by mouth every 6 (six) hours as needed for nausea or vomiting. Patient not taking: Reported on 09/01/2017 03/06/17   Minna Antis, MD  ranitidine (ZANTAC) 150 MG tablet Take 1 tablet  (150 mg total) by mouth 2 (two) times daily. 02/16/16 02/15/17  Willy Eddy, MD  tamsulosin (FLOMAX) 0.4 MG CAPS capsule Take 1 capsule (0.4 mg total) by mouth daily. Patient not taking: Reported on 09/01/2017 03/06/17   Minna Antis, MD    History reviewed. No pertinent family history.   Social History   Tobacco Use  . Smoking status: Never Smoker  . Smokeless tobacco: Never Used  Substance Use Topics  . Alcohol use: No  . Drug use: Yes    Types: Marijuana    Allergies as of 09/01/2017 - Review Complete 09/01/2017  Allergen Reaction Noted  . Penicillins Anaphylaxis 02/16/2016  . Compazine [prochlorperazine edisylate] Other (See Comments) 02/16/2016  . Demerol [meperidine] Hives and Other (See Comments) 02/16/2016  . Reglan [metoclopramide] Other (See Comments) 02/16/2016  . Toradol [ketorolac tromethamine] Other (See Comments) 02/16/2016  . Zofran [ondansetron hcl]  03/06/2017    Review of Systems:    All systems reviewed and negative except where noted in HPI.   Physical Exam:  BP (!) 164/125   Pulse 88   Temp 98.1 F (36.7 C) (Oral)   Ht 5\' 4"  (1.626 m)   Wt 213 lb 6.4 oz (96.8 kg)   BMI 36.63 kg/m  No LMP recorded. Patient has had a hysterectomy. Psych:  Alert and cooperative. Normal mood and affect. General:   Alert,  Well-developed, well-nourished, pleasant and cooperative in NAD Head:  Normocephalic and atraumatic. Eyes:  Sclera clear, no icterus.   Conjunctiva pink. Ears:  Normal auditory acuity. Nose:  No deformity, discharge, or lesions. Mouth:  No deformity or lesions,oropharynx pink & moist. Neck:  Supple; no masses or thyromegaly. Lungs:  Respirations even and unlabored.  Clear throughout to auscultation.   No wheezes, crackles, or rhonchi. No acute distress. Heart:  Regular rate and rhythm; no murmurs, clicks, rubs, or gallops. Abdomen:  Normal bowel sounds.  No bruits.  Soft, non-tender and non-distended without masses, hepatosplenomegaly or  hernias noted.  No guarding or rebound tenderness.    Msk:  Symmetrical without gross deformities. Good, equal movement & strength bilaterally. Pulses:  Normal pulses noted. Extremities:  No clubbing or edema.  No cyanosis. Neurologic:  Alert and oriented x3;  grossly normal neurologically. Skin:  Intact without significant lesions or rashes. No jaundice. Lymph Nodes:  No significant cervical adenopathy. Psych:  Alert and cooperative. Normal mood and affect.   Labs: CBC    Component Value Date/Time   WBC 7.7 08/25/2017 1706   RBC 4.52 08/25/2017 1706   HGB 14.7 08/25/2017 1706   HCT 42.0 08/25/2017 1706   PLT 250 08/25/2017 1706   MCV 92.9 08/25/2017 1706   MCH 32.4 08/25/2017 1706   MCHC 34.9 08/25/2017 1706   RDW 13.1 08/25/2017 1706   LYMPHSABS 2.2 01/11/2017 0601   MONOABS 0.5 01/11/2017 0601   EOSABS  0.2 01/11/2017 0601   BASOSABS 0.0 01/11/2017 0601   CMP     Component Value Date/Time   NA 138 08/25/2017 1706   K 4.0 08/25/2017 1706   CL 101 08/25/2017 1706   CO2 27 08/25/2017 1706   GLUCOSE 124 (H) 08/25/2017 1706   BUN 9 08/25/2017 1706   CREATININE 0.68 08/25/2017 1706   CALCIUM 9.9 08/25/2017 1706   PROT 8.0 05/22/2016 0759   ALBUMIN 4.4 05/22/2016 0759   AST 44 (H) 05/22/2016 0759   ALT 32 05/22/2016 0759   ALKPHOS 79 05/22/2016 0759   BILITOT 1.2 05/22/2016 0759   GFRNONAA >60 08/25/2017 1706   GFRAA >60 08/25/2017 1706    Imaging Studies: CT report in October 2018, shows fatty infiltration of the liver.  Assessment and Plan:   Florene GlenDimetra Tillison is a 42 y.o. y/o female has been referred for intermittent dysphagia and GERD  Symptoms started a year ago, and 2 years ago [place] was [place] This could be a simple esophagitis EGD is indicated to rule out strictures, evaluate for EOE, dilated and strictures, if no inflammation present around it We will start her on Prilosec at this time Patient states she would like to wait to schedule the  EGD for review of weeks She will call us when she is ready to schedule, as to figure out her insurance issues In the meantime Patient educated extensively on acid reflux lifestyle modification, including buying a bed wedge, not eating 3 hrs before bedtime, diet modifications, and handout given for the same.  (Risks of PPI use were discussed with patient including bone loss, C. Diff diarrhea, pneumonia, infections, CKD, electrolyte abnormalities.  If clinically possible based on symptoms, goal would be to maintain patient on the lowest dose possible, or discontinue the medication with institution of acid reflux lifestyle modifications over time. Pt. Verbalizes understanding and chooses to continue the medication.)  Her initial blood pressure was elevated today, however repeat was 140/84 Patient states it is elevated at home as well Denies any chest pain, changes in her vision, headaches this time She has not taken 1 of her blood pressure medications yet today She does not want to go to the ER to have this evaluated at this time.  She states she will go home and take her blood pressure medication.  I have asked her to come to the ER, she develops headaches, chest pain, vision changes or any other reason for concern. I have asked her to be compliant with her medications, and follow-up with her primary care provider closely for this as well She verbalized understanding  Weight loss will help with her fatty liver.  This is encouraged No signs of cirrhosis present at this time.   Dr Tina BouillonVarnita Watson Robarge

## 2017-09-16 ENCOUNTER — Emergency Department
Admission: EM | Admit: 2017-09-16 | Discharge: 2017-09-16 | Disposition: A | Discharge status: Home / Self Care | Payer: Self-pay | Attending: Emergency Medicine | Admitting: Emergency Medicine

## 2017-09-16 ENCOUNTER — Encounter: Payer: Self-pay | Admitting: Emergency Medicine

## 2017-09-16 ENCOUNTER — Ambulatory Visit: Payer: Self-pay | Admitting: Gastroenterology

## 2017-09-16 ENCOUNTER — Other Ambulatory Visit: Payer: Self-pay

## 2017-09-16 ENCOUNTER — Emergency Department: Payer: Self-pay

## 2017-09-16 DIAGNOSIS — Z79899 Other long term (current) drug therapy: Secondary | ICD-10-CM | POA: Insufficient documentation

## 2017-09-16 DIAGNOSIS — I10 Essential (primary) hypertension: Secondary | ICD-10-CM | POA: Insufficient documentation

## 2017-09-16 DIAGNOSIS — N390 Urinary tract infection, site not specified: Secondary | ICD-10-CM | POA: Insufficient documentation

## 2017-09-16 LAB — BASIC METABOLIC PANEL
ANION GAP: 7 (ref 5–15)
BUN: 11 mg/dL (ref 6–20)
CALCIUM: 9.5 mg/dL (ref 8.9–10.3)
CO2: 25 mmol/L (ref 22–32)
Chloride: 103 mmol/L (ref 101–111)
Creatinine, Ser: 0.59 mg/dL (ref 0.44–1.00)
GLUCOSE: 141 mg/dL — AB (ref 65–99)
POTASSIUM: 4.2 mmol/L (ref 3.5–5.1)
SODIUM: 135 mmol/L (ref 135–145)

## 2017-09-16 LAB — URINALYSIS, COMPLETE (UACMP) WITH MICROSCOPIC
BACTERIA UA: NONE SEEN
Bilirubin Urine: NEGATIVE
Glucose, UA: NEGATIVE mg/dL
Hgb urine dipstick: NEGATIVE
KETONES UR: NEGATIVE mg/dL
Leukocytes, UA: NEGATIVE
Nitrite: POSITIVE — AB
PROTEIN: NEGATIVE mg/dL
Specific Gravity, Urine: 1.01 (ref 1.005–1.030)
pH: 7 (ref 5.0–8.0)

## 2017-09-16 LAB — POCT PREGNANCY, URINE: PREG TEST UR: NEGATIVE

## 2017-09-16 LAB — CBC
HEMATOCRIT: 41.7 % (ref 35.0–47.0)
Hemoglobin: 14.7 g/dL (ref 12.0–16.0)
MCH: 32.6 pg (ref 26.0–34.0)
MCHC: 35.2 g/dL (ref 32.0–36.0)
MCV: 92.8 fL (ref 80.0–100.0)
Platelets: 245 10*3/uL (ref 150–440)
RBC: 4.49 MIL/uL (ref 3.80–5.20)
RDW: 13 % (ref 11.5–14.5)
WBC: 9.7 10*3/uL (ref 3.6–11.0)

## 2017-09-16 MED ORDER — NITROFURANTOIN MONOHYD MACRO 100 MG PO CAPS: 100.0000 mg | ORAL_CAPSULE | Freq: Two times a day (BID) | ORAL | 0 refills | Status: AC

## 2017-09-16 MED ORDER — HYDROCODONE-ACETAMINOPHEN 5-325 MG PO TABS
1.0000 | ORAL_TABLET | ORAL | 0 refills | Status: DC | PRN
Start: 2017-09-16 — End: 2018-09-23

## 2017-09-16 MED ORDER — IBUPROFEN 400 MG PO TABS
600.0000 mg | ORAL_TABLET | Freq: Once | ORAL | Status: AC
  Administered 2017-09-16: 600 mg via ORAL
  Filled 2017-09-16: qty 2

## 2017-09-16 MED ORDER — TAMSULOSIN HCL 0.4 MG PO CAPS: 0.4000 mg | ORAL_CAPSULE | Freq: Every day | ORAL | 0 refills | Status: DC

## 2017-09-16 NOTE — ED Triage Notes (Signed)
Pt in via POV with complaints of left pelvic pain w/ some nausea.  Pt states, "I have been taking AZO for a week thinking I had a UTI."  Pt concerned for kidney stone due to hx of same.  Pt hypertensive, reports hx of same, reports taking meds as prescribed.  NAD noted at this time.

## 2017-09-16 NOTE — ED Provider Notes (Signed)
[facility] Emergency Department Provider Note  Time seen: 6:16 PM  I have reviewed the triage vital signs and the nursing notes.   HISTORY  Chief Complaint Nephrolithiasis    HPI Tina Brennan is a 42 y.o. female with a past medical history of hypertension, kidney stones, presents to the emergency department for left-sided abdominal pain.  According to the patient for the past 5 or 6 days she has been experiencing mild dysuria with pain in her left flank.  States she thought it was a urinary tract infection so she began taking over-the-counter Azo, but states she continues to have discomfort and now the back pain has worsened.  Patient also states significant kidney stones in the past including 2 of which requiring surgical intervention and was concerned that she was possibly having a kidney stone.  Denies any fever.  States nausea but denies vomiting.  Denies diarrhea.  Describes her pain as a 5 or 6/10 currently aching pain mostly in the left back.   Past Medical History:  Diagnosis Date  . Hypertension   . IBS (irritable bowel syndrome)   . Pancreatitis     There are no active problems to display for this patient.   Past Surgical History:  Procedure Laterality Date  . ABDOMINAL HYSTERECTOMY    . BLADDER SURGERY    . cyst removal of ovaries      Prior to Admission medications   Medication Sig Start Date End Date Taking? Authorizing Provider  amLODipine (NORVASC) 5 MG tablet Take 1 tablet (5 mg total) by mouth daily. 12/07/16 03/07/17  Menshew, Charlesetta Ivory, PA-C  ciprofloxacin (CIPRO) 500 MG tablet Take 1 tablet (500 mg total) by mouth 2 (two) times daily. Patient not taking: Reported on 09/01/2017 05/22/16   Jene Every, MD  cloNIDine (CATAPRES) 0.1 MG tablet Take 1 tablet (0.1 mg total) by mouth 2 (two) times daily. 05/22/16 05/22/17  Jene Every, MD  cyclobenzaprine (FLEXERIL) 5 MG tablet Take 1 tablet (5 mg total) by mouth 3 (three) times  daily as needed for muscle spasms. Patient not taking: Reported on 05/22/2016 02/16/16   Willy Eddy, MD  dicyclomine (BENTYL) 20 MG tablet Take 1 tablet (20 mg total) by mouth 3 (three) times daily as needed for spasms. 10/06/16   Sharman Cheek, MD  dicyclomine (BENTYL) 20 MG tablet Take 1 tablet (20 mg total) by mouth 3 (three) times daily as needed for spasms. 12/07/16 02/05/17  Menshew, Charlesetta Ivory, PA-C  ibuprofen (ADVIL,MOTRIN) 800 MG tablet Take 800 mg by mouth 2 (two) times daily.    [provider]  lisinopril (PRINIVIL,ZESTRIL) 20 MG tablet Take 1 tablet (20 mg total) by mouth daily. 10/06/16 10/06/17  Sharman Cheek, MD  lisinopril (PRINIVIL,ZESTRIL) 20 MG tablet Take 1 tablet (20 mg total) by mouth daily. 12/07/16 03/07/17  Menshew, Charlesetta Ivory, PA-C  metoprolol succinate (TOPROL XL) 50 MG 24 hr tablet Take 1 tablet (50 mg total) by mouth daily. Take with or immediately following a meal. 10/06/16 10/06/17  Sharman Cheek, MD  metoprolol succinate (TOPROL XL) 50 MG 24 hr tablet Take 1 tablet (50 mg total) by mouth daily. Take with or immediately following a meal. 12/07/16 03/07/17  Menshew, Charlesetta Ivory, PA-C  omeprazole (PRILOSEC) 20 MG capsule Take 1 capsule (20 mg total) by mouth daily. 09/01/17   Pasty Spillers, MD  oxyCODONE-acetaminophen (ROXICET) 5-325 MG tablet Take 1 tablet by mouth every 6 (six) hours as needed. Patient not taking:  Reported on 09/01/2017 03/06/17   Minna Antis, MD  promethazine (PHENERGAN) 25 MG tablet Take 1 tablet (25 mg total) by mouth every 6 (six) hours as needed for nausea or vomiting. Patient not taking: Reported on 09/01/2017 03/06/17   Minna Antis, MD  ranitidine (ZANTAC) 150 MG tablet Take 1 tablet (150 mg total) by mouth 2 (two) times daily. 02/16/16 02/15/17  Willy Eddy, MD  tamsulosin (FLOMAX) 0.4 MG CAPS capsule Take 1 capsule (0.4 mg total) by mouth daily. Patient not taking: Reported on 09/01/2017 03/06/17   Minna Antis, MD    Allergies  Allergen Reactions  . Penicillins Anaphylaxis  . Compazine [Prochlorperazine Edisylate] Other (See Comments)    Dystonia   . Demerol [Meperidine] Hives and Other (See Comments)    Dystonia and Fever Blisters.   . Reglan [Metoclopramide] Other (See Comments)    Dystonia   . Toradol [Ketorolac Tromethamine] Other (See Comments)    Dystonia   . Zofran [Ondansetron Hcl]     Dystonia     No family history on file.  Social History Social History   Tobacco Use  . Smoking status: Never Smoker  . Smokeless tobacco: Never Used  Substance Use Topics  . Alcohol use: No  . Drug use: Yes    Types: Marijuana    Review of Systems Constitutional: Negative for fever. Eyes: Negative for visual complaints ENT: Negative for recent illness/congestion Cardiovascular: Negative for chest pain. Respiratory: Negative for shortness of breath. Gastrointestinal: Left abdominal pain, left back pain.  Positive for nausea.  Negative for vomiting or diarrhea Genitourinary: Negative for dysuria/hematuria Musculoskeletal: Negative for musculoskeletal complaints Skin: Negative for skin complaints  Neurological: Negative for headache All other ROS negative  ____________________________________________   PHYSICAL EXAM:  VITAL SIGNS: ED Triage Vitals  Enc Vitals Group     BP 09/16/17 1500 (!) 192/110     Pulse Rate 09/16/17 1500 (!) 106     Resp 09/16/17 1500 16     Temp 09/16/17 1500 98.3 F (36.8 C)     Temp Source 09/16/17 1500 Oral     SpO2 09/16/17 1500 96 %     Weight 09/16/17 1504 199 lb (90.3 kg)     Height 09/16/17 1504 5\' 4"  (1.626 m)     Head Circumference --      Peak Flow --      Pain Score 09/16/17 1503 7     Pain Loc --      Pain Edu? --      Excl. in GC? --    Constitutional: Alert and oriented. Well appearing and in no distress. Eyes: Normal exam ENT   Head: Normocephalic and atraumatic.   Mouth/Throat: Mucous membranes are  moist. Cardiovascular: Normal rate, regular rhythm. No murmur Respiratory: Normal respiratory effort without tachypnea nor retractions. Breath sounds are clear Gastrointestinal: Slight left lower quadrant tenderness to palpation.  No rebound or guarding.  No distention.  No CVA tenderness. Musculoskeletal: Nontender with normal range of motion in all extremities. No lower extremity tenderness or edema. Neurologic:  Normal speech and language. No gross focal neurologic deficits Skin:  Skin is warm, dry and intact.  Psychiatric: Mood and affect are normal.   ____________________________________________  RADIOLOGY  CT shows 4 mm bladder stone.  2 mm stone within the kidney.  ____________________________________________   INITIAL IMPRESSION / ASSESSMENT AND PLAN / ED COURSE  Pertinent labs & imaging results that were available during my care of the patient were reviewed by me  and considered in my medical decision making (see chart for details).  Patient presents to the emergency department for mild dysuria and left flank pain.  History of kidney stones in the past.  Differential would include urinary tract infection, pyelonephritis, ureterolithiasis, renal colic, colitis or diverticulitis.  Patient's labs are largely within normal limits, normal white blood cell count, normal chemistry normal kidney function.  Patient's urinalysis although shows no red cells or white cells it is nitrite positive indicating possible E. coli infection.  Given the patient's history of kidney stones in the past we will obtain a CT renal scan to rule out ureterolithiasis.  Regardless patient will be placed on antibiotics for presumed urinary tract infection.  Urine culture has been added onto the patient's urine.  CT shows bladder stone as well as 2 mm kidney stone within the kidney mid but no ureterolithiasis.  We will discharged with antibiotics, short course of pain medication and Flomax.  Patient will follow up  with urology.  Patient agreeable to this plan of care. ____________________________________________   FINAL CLINICAL IMPRESSION(S) / ED DIAGNOSES  Left flank pain Urinary tract infection    Minna AntisPaduchowski, Teola Felipe, MD 09/16/17 1933

## 2017-09-16 NOTE — ED Notes (Signed)

## 2017-09-16 NOTE — ED Notes (Signed)
Patient transported to CT 

## 2017-09-18 LAB — URINE CULTURE: CULTURE: NO GROWTH

## 2018-01-31 ENCOUNTER — Emergency Department
Admission: EM | Admit: 2018-01-31 | Discharge: 2018-01-31 | Discharge status: Against Medical Advice | Payer: Self-pay | Attending: Emergency Medicine | Admitting: Emergency Medicine

## 2018-01-31 ENCOUNTER — Encounter: Payer: Self-pay | Admitting: Emergency Medicine

## 2018-01-31 DIAGNOSIS — I10 Essential (primary) hypertension: Secondary | ICD-10-CM | POA: Insufficient documentation

## 2018-01-31 DIAGNOSIS — R51 Headache: Secondary | ICD-10-CM | POA: Insufficient documentation

## 2018-01-31 DIAGNOSIS — Z79899 Other long term (current) drug therapy: Secondary | ICD-10-CM | POA: Insufficient documentation

## 2018-01-31 DIAGNOSIS — R519 Headache, unspecified: Secondary | ICD-10-CM

## 2018-01-31 LAB — BASIC METABOLIC PANEL
ANION GAP: 10 (ref 5–15)
BUN: 13 mg/dL (ref 6–20)
CALCIUM: 9.1 mg/dL (ref 8.9–10.3)
CO2: 27 mmol/L (ref 22–32)
Chloride: 100 mmol/L (ref 98–111)
Creatinine, Ser: 0.69 mg/dL (ref 0.44–1.00)
Glucose, Bld: 190 mg/dL — ABNORMAL HIGH (ref 70–99)
Potassium: 3.9 mmol/L (ref 3.5–5.1)
SODIUM: 137 mmol/L (ref 135–145)

## 2018-01-31 LAB — CBC
HEMATOCRIT: 40.6 % (ref 35.0–47.0)
Hemoglobin: 14.4 g/dL (ref 12.0–16.0)
MCH: 32.8 pg (ref 26.0–34.0)
MCHC: 35.4 g/dL (ref 32.0–36.0)
MCV: 92.5 fL (ref 80.0–100.0)
PLATELETS: 235 10*3/uL (ref 150–440)
RBC: 4.39 MIL/uL (ref 3.80–5.20)
RDW: 12.9 % (ref 11.5–14.5)
WBC: 7.5 10*3/uL (ref 3.6–11.0)

## 2018-01-31 LAB — TROPONIN I

## 2018-01-31 MED ORDER — DIPHENHYDRAMINE HCL 50 MG/ML IJ SOLN
25.0000 mg | Freq: Once | INTRAMUSCULAR | Status: DC
  Filled 2018-01-31: qty 1

## 2018-01-31 MED ORDER — PROMETHAZINE HCL 25 MG/ML IJ SOLN
25.0000 mg | Freq: Once | INTRAMUSCULAR | Status: DC
  Filled 2018-01-31: qty 1

## 2018-01-31 MED ORDER — SODIUM CHLORIDE 0.9 % IV BOLUS: 1000.0000 mL | Freq: Once | INTRAVENOUS | Status: DC

## 2018-01-31 MED ORDER — METOPROLOL TARTRATE 5 MG/5ML IV SOLN
5.0000 mg | Freq: Once | INTRAVENOUS | Status: DC
  Filled 2018-01-31: qty 5

## 2018-01-31 MED ORDER — METHYLPREDNISOLONE SODIUM SUCC 125 MG IJ SOLR
125.0000 mg | Freq: Once | INTRAMUSCULAR | Status: DC
  Filled 2018-01-31: qty 2

## 2018-01-31 MED ORDER — METOPROLOL TARTRATE 25 MG PO TABS
25.0000 mg | ORAL_TABLET | Freq: Once | ORAL | Status: AC
  Administered 2018-01-31: 25 mg via ORAL
  Filled 2018-01-31: qty 1

## 2018-01-31 MED ORDER — BUTALBITAL-APAP-CAFFEINE 50-325-40 MG PO TABS
2.0000 | ORAL_TABLET | Freq: Once | ORAL | Status: AC
Start: 2018-01-31 — End: 2018-01-31
  Administered 2018-01-31: 2 via ORAL
  Filled 2018-01-31: qty 2

## 2018-01-31 MED ORDER — PROMETHAZINE HCL 25 MG PO TABS
25.0000 mg | ORAL_TABLET | Freq: Once | ORAL | Status: AC
  Administered 2018-01-31: 25 mg via ORAL
  Filled 2018-01-31: qty 1

## 2018-01-31 NOTE — ED Notes (Addendum)
First IV infiltrated and removed by this RN. Pt upset this RN took infiltrated IV out before giving phenergan. Pt educated as to why we cannot give medication through an infiltrated IV and particularly that pt does not want phenergan infiltrated. Pt still upset. This RN attempted IV stick x2. Pt jerked back and said "that hurts! Take that thing out." Total 2 RNs attempted stick. IV team consult placed by this RN, OK per EDP.   Delay in medication d/t loss of access.

## 2018-01-31 NOTE — ED Triage Notes (Signed)
Patient with complaint of headache that started today. Patient states that she took Advil with no relief. Patient states that she thought her blood pressure maybe elevated so she took an extra lisinopril about 4 hours ago.

## 2018-01-31 NOTE — ED Notes (Signed)
Pt wanting to leave. This RN expressed to patient that EDP would like to see her blood pressure decrease and headache to improve prior to leaving. Pt refused to stay long enough. States "you just gave me my metoprolol. There's no reason I can't just go home and sleep. I'm tired and want to leave." Pt signed out AMA. Accompanied by family at time of discharge. MD aware.

## 2018-01-31 NOTE — ED Provider Notes (Signed)
[facility] Emergency Department Provider Note   ____________________________________________   First MD Initiated Contact with Patient 01/31/18 [phone]     (approximate)  I have reviewed the triage vital signs and the nursing notes.   HISTORY  Chief Complaint Headache    HPI Tina Brennan is a 42 y.o. female who comes into the hospital today with a really bad headache.  She states that it started around 3.  She went to dinner and it did not get any better.  The patient took some Advil and thought maybe her blood pressure was elevated so she took an extra dose of lisinopril.  The patient states that she is unable to check her blood pressure because her cuff is not working.  The patient normally takes metoprolol in the morning but does not always take her lisinopril at night.  The patient has had some nausea with no blurred vision no numbness no tingling no weakness and no chest pain.  She states that her heart does feel little fluttery.  She came into the hospital today for evaluation and saw that her blood pressure was significantly elevated.  She states that her pain is a 7 out of 10 in intensity.  She has had a headache this bad in the past when her blood pressure was elevated.   Past Medical History:  Diagnosis Date  . Hypertension   . IBS (irritable bowel syndrome)   . Pancreatitis     There are no active problems to display for this patient.   Past Surgical History:  Procedure Laterality Date  . ABDOMINAL HYSTERECTOMY    . BLADDER SURGERY    . cyst removal of ovaries      Prior to Admission medications   Medication Sig Start Date End Date Taking? Authorizing Provider  amLODipine (NORVASC) 5 MG tablet Take 1 tablet (5 mg total) by mouth daily. 12/07/16 03/07/17  Menshew, Charlesetta Ivory, PA-C  ciprofloxacin (CIPRO) 500 MG tablet Take 1 tablet (500 mg total) by mouth 2 (two) times daily. Patient not taking: Reported on 09/01/2017 05/22/16   Jene Every, MD  cloNIDine (CATAPRES) 0.1 MG tablet Take 1 tablet (0.1 mg total) by mouth 2 (two) times daily. 05/22/16 05/22/17  Jene Every, MD  cyclobenzaprine (FLEXERIL) 5 MG tablet Take 1 tablet (5 mg total) by mouth 3 (three) times daily as needed for muscle spasms. Patient not taking: Reported on 05/22/2016 02/16/16   Willy Eddy, MD  dicyclomine (BENTYL) 20 MG tablet Take 1 tablet (20 mg total) by mouth 3 (three) times daily as needed for spasms. 10/06/16   Sharman Cheek, MD  dicyclomine (BENTYL) 20 MG tablet Take 1 tablet (20 mg total) by mouth 3 (three) times daily as needed for spasms. 12/07/16 02/05/17  Menshew, Charlesetta Ivory, PA-C  HYDROcodone-acetaminophen (NORCO/VICODIN) 5-325 MG tablet Take 1 tablet by mouth every 4 (four) hours as needed. 09/16/17   Minna Antis, MD  ibuprofen (ADVIL,MOTRIN) 800 MG tablet Take 800 mg by mouth 2 (two) times daily.    [provider]  lisinopril (PRINIVIL,ZESTRIL) 20 MG tablet Take 1 tablet (20 mg total) by mouth daily. 10/06/16 10/06/17  Sharman Cheek, MD  lisinopril (PRINIVIL,ZESTRIL) 20 MG tablet Take 1 tablet (20 mg total) by mouth daily. 12/07/16 03/07/17  Menshew, Charlesetta Ivory, PA-C  metoprolol succinate (TOPROL XL) 50 MG 24 hr tablet Take 1 tablet (50 mg total) by mouth daily. Take with or immediately following a meal. 10/06/16 10/06/17  Sharman Cheek,  MD  metoprolol succinate (TOPROL XL) 50 MG 24 hr tablet Take 1 tablet (50 mg total) by mouth daily. Take with or immediately following a meal. 12/07/16 03/07/17  Menshew, Charlesetta Ivory, PA-C  omeprazole (PRILOSEC) 20 MG capsule Take 1 capsule (20 mg total) by mouth daily. 09/01/17   Pasty Spillers, MD  oxyCODONE-acetaminophen (ROXICET) 5-325 MG tablet Take 1 tablet by mouth every 6 (six) hours as needed. Patient not taking: Reported on 09/01/2017 03/06/17   Minna Antis, MD  promethazine (PHENERGAN) 25 MG tablet Take 1 tablet (25 mg total) by mouth every 6 (six) hours as needed  for nausea or vomiting. Patient not taking: Reported on 09/01/2017 03/06/17   Minna Antis, MD  ranitidine (ZANTAC) 150 MG tablet Take 1 tablet (150 mg total) by mouth 2 (two) times daily. 02/16/16 02/15/17  Willy Eddy, MD  tamsulosin (FLOMAX) 0.4 MG CAPS capsule Take 1 capsule (0.4 mg total) by mouth daily. 09/16/17   Minna Antis, MD    Allergies Penicillins; Compazine [prochlorperazine edisylate]; Demerol [meperidine]; Reglan [metoclopramide]; Toradol [ketorolac tromethamine]; and Zofran [ondansetron hcl]  No family history on file.  Social History Social History   Tobacco Use  . Smoking status: Never Smoker  . Smokeless tobacco: Never Used  Substance Use Topics  . Alcohol use: No  . Drug use: Yes    Types: Marijuana    Comment: occ    Review of Systems  Constitutional: No fever/chills Eyes: No visual changes. ENT: No sore throat. Cardiovascular: Denies chest pain. Respiratory: Denies shortness of breath. Gastrointestinal: Nausea with no abdominal pain. no vomiting.  No diarrhea.  No constipation. Genitourinary: Negative for dysuria. Musculoskeletal: Negative for back pain. Skin: Negative for rash. Neurological: Headache   ____________________________________________   PHYSICAL EXAM:  VITAL SIGNS: ED Triage Vitals  Enc Vitals Group     BP 01/31/18 0137 (!) 222/130     Pulse Rate 01/31/18 0137 92     Resp 01/31/18 0137 18     Temp 01/31/18 0137 98.1 F (36.7 C)     Temp Source 01/31/18 0137 Oral     SpO2 01/31/18 0137 97 %     Weight 01/31/18 0140 210 lb (95.3 kg)     Height 01/31/18 0140 5\' 4"  (1.626 m)     Head Circumference --      Peak Flow --      Pain Score 01/31/18 0140 7     Pain Loc --      Pain Edu? --      Excl. in GC? --     Constitutional: Alert and oriented. Well appearing and in moderate distress. Eyes: Conjunctivae are normal. PERRL. EOMI. Head: Atraumatic. Nose: No congestion/rhinnorhea. Mouth/Throat: Mucous membranes  are moist.  Oropharynx non-erythematous. Cardiovascular: Normal rate, regular rhythm. Grossly normal heart sounds.  Good peripheral circulation. Respiratory: Normal respiratory effort.  No retractions. Lungs CTAB. Gastrointestinal: Soft and nontender. No distention.  Positive bowel sounds. Musculoskeletal: No lower extremity tenderness nor edema.   Neurologic:  Normal speech and language.  Cranial nerves II through XII are grossly intact with no focal motor neuro deficit Skin:  Skin is warm, dry and intact.  Psychiatric: Mood and affect are normal.   ____________________________________________   LABS (all labs ordered are listed, but only abnormal results are displayed)  Labs Reviewed  BASIC METABOLIC PANEL - Abnormal; Notable for the following components:      Result Value   Glucose, Bld 190 (*)    All other components within  normal limits  CBC  TROPONIN I   ____________________________________________  EKG ED ECG REPORT I, Rebecka Apley, the attending physician, personally viewed and interpreted this ECG.   Date: 01/31/2018  EKG Time: 159  Rate: 100  Rhythm: normal sinus rhythm  Axis: right axis deviation  Intervals:none  ST&T Change: none   ____________________________________________  RADIOLOGY  ED MD interpretation:  none  Official radiology report(s): No results found.  ____________________________________________   PROCEDURES  Procedure(s) performed: None  Procedures  Critical Care performed: No  ____________________________________________   INITIAL IMPRESSION / ASSESSMENT AND PLAN / ED COURSE  As part of my medical decision making, I reviewed the following data within the electronic MEDICAL RECORD NUMBER Notes from prior ED visits and [place] Controlled Substance Database   This is a 42 year old female who comes into the hospital today with a headache and elevated blood pressure.  The patient does not take her medications regularly and has been  on multiple medications in the past.  I did order some medication for the patient to include Phenergan, Benadryl, normal saline, Solu-Medrol and a liter of normal saline.  The patient was also to receive some metoprolol.  He did have a hard time getting an IV line established on the patient.  We then decided to give her some oral medicines to include Fioricet, oral Phenergan and oral Lopressor.  The patient had a CBC BMP and a troponin done which were all unremarkable.  We did attempt to use the IV team to establish a line which was not successful.  After the patient received her oral medications she asked to be discharged.  The patient blood pressure was still elevated and we did ask her to stay so that we can continue to monitor as her blood pressure improved.  The patient states that she did not want to stay.  She was then advised that she would have to sign out AGAINST MEDICAL ADVICE if she were to leave.  The patient decided to sign out AGAINST MEDICAL ADVICE.  She should follow-up with her primary care physician.      ____________________________________________   FINAL CLINICAL IMPRESSION(S) / ED DIAGNOSES  Final diagnoses:  Acute intractable headache, unspecified headache type  Hypertension, unspecified type     ED Discharge Orders    None       Note:  This document was prepared using Dragon voice recognition software and may include unintentional dictation errors.    Rebecka Apley, MD 01/31/18 [phone]

## 2018-01-31 NOTE — ED Notes (Signed)
IV team at bedside 

## 2018-04-24 ENCOUNTER — Other Ambulatory Visit: Payer: Self-pay

## 2018-04-24 ENCOUNTER — Emergency Department
Admission: EM | Admit: 2018-04-24 | Discharge: 2018-04-24 | Disposition: A | Discharge status: Home / Self Care | Payer: Self-pay | Attending: Emergency Medicine | Admitting: Emergency Medicine

## 2018-04-24 ENCOUNTER — Emergency Department: Payer: Self-pay

## 2018-04-24 ENCOUNTER — Encounter: Payer: Self-pay | Admitting: Emergency Medicine

## 2018-04-24 DIAGNOSIS — S43421A Sprain of right rotator cuff capsule, initial encounter: Secondary | ICD-10-CM | POA: Insufficient documentation

## 2018-04-24 DIAGNOSIS — F1210 Cannabis abuse, uncomplicated: Secondary | ICD-10-CM | POA: Insufficient documentation

## 2018-04-24 DIAGNOSIS — Z79899 Other long term (current) drug therapy: Secondary | ICD-10-CM | POA: Insufficient documentation

## 2018-04-24 DIAGNOSIS — Y939 Activity, unspecified: Secondary | ICD-10-CM | POA: Insufficient documentation

## 2018-04-24 DIAGNOSIS — Y92012 Bathroom of single-family (private) house as the place of occurrence of the external cause: Secondary | ICD-10-CM | POA: Insufficient documentation

## 2018-04-24 DIAGNOSIS — M25511 Pain in right shoulder: Secondary | ICD-10-CM

## 2018-04-24 DIAGNOSIS — Y998 Other external cause status: Secondary | ICD-10-CM | POA: Insufficient documentation

## 2018-04-24 DIAGNOSIS — W182XXA Fall in (into) shower or empty bathtub, initial encounter: Secondary | ICD-10-CM | POA: Insufficient documentation

## 2018-04-24 DIAGNOSIS — I10 Essential (primary) hypertension: Secondary | ICD-10-CM | POA: Insufficient documentation

## 2018-04-24 MED ORDER — PREDNISONE 20 MG PO TABS
60.0000 mg | ORAL_TABLET | Freq: Once | ORAL | Status: AC
  Administered 2018-04-24: 60 mg via ORAL
  Filled 2018-04-24: qty 3

## 2018-04-24 MED ORDER — PREDNISONE 10 MG PO TABS: 10.0000 mg | ORAL_TABLET | Freq: Every day | ORAL | 0 refills | Status: DC

## 2018-04-24 NOTE — ED Notes (Signed)
Pt verbalized understanding of discharge instructions. NAD at this time. 

## 2018-04-24 NOTE — ED Notes (Signed)
Pt had  r shoulder pulled  When  Walking dog  fell in shower 10  Days  Ago  Pain in  r pt  Has  Pain on rom

## 2018-04-24 NOTE — ED Provider Notes (Signed)
[facility] EMERGENCY DEPARTMENT Provider Note   CSN: [id number] Arrival date & time: 04/24/18  1605     History   Chief Complaint Chief Complaint  Patient presents with  . Shoulder Pain    HPI Tina Brennan is a 42 y.o. female presents emergency department for evaluation of right shoulder pain.  Patient states 6 weeks ago she was walking her dog on a leash when the dog jerked her right arm she developed pain and catching throughout the right anterior shoulder.  Pain slowly improving up until 10 days ago when she fell on the right shoulder in the shower.  Patient denies hitting her head, losing consciousness, nausea or vomiting.  She is only complaining of right shoulder pain and denies any other injury to her body.  Shoulder pain is increased with reaching behind her back and overhead.  She denies any neck pain numbness tingling radicular symptoms.  She is been taking Tylenol 1000 mg and ibuprofen 800 mg throughout the day.  Her pain is currently 4 out of 10 but increased with overhead lifting.  She is right-hand dominant.  She has a history of hypertension, states she is taking her metoprolol lisinopril daily as prescribed.  Denies any headaches, vision changes, chest pain, shortness of breath.  HPI  Past Medical History:  Diagnosis Date  . Hypertension   . IBS (irritable bowel syndrome)   . Pancreatitis     There are no active problems to display for this patient.   Past Surgical History:  Procedure Laterality Date  . ABDOMINAL HYSTERECTOMY    . BLADDER SURGERY    . cyst removal of ovaries       OB History   None      Home Medications    Prior to Admission medications   Medication Sig Start Date End Date Taking? Authorizing Provider  amLODipine (NORVASC) 5 MG tablet Take 1 tablet (5 mg total) by mouth daily. 12/07/16 03/07/17  Menshew, Charlesetta Ivory, PA-C  ciprofloxacin (CIPRO) 500 MG tablet Take 1 tablet (500 mg total) by mouth 2 (two)  times daily. Patient not taking: Reported on 09/01/2017 05/22/16   Jene Every, MD  cloNIDine (CATAPRES) 0.1 MG tablet Take 1 tablet (0.1 mg total) by mouth 2 (two) times daily. 05/22/16 05/22/17  Jene Every, MD  cyclobenzaprine (FLEXERIL) 5 MG tablet Take 1 tablet (5 mg total) by mouth 3 (three) times daily as needed for muscle spasms. Patient not taking: Reported on 05/22/2016 02/16/16   Willy Eddy, MD  dicyclomine (BENTYL) 20 MG tablet Take 1 tablet (20 mg total) by mouth 3 (three) times daily as needed for spasms. 10/06/16   Sharman Cheek, MD  dicyclomine (BENTYL) 20 MG tablet Take 1 tablet (20 mg total) by mouth 3 (three) times daily as needed for spasms. 12/07/16 02/05/17  Menshew, Charlesetta Ivory, PA-C  HYDROcodone-acetaminophen (NORCO/VICODIN) 5-325 MG tablet Take 1 tablet by mouth every 4 (four) hours as needed. 09/16/17   Minna Antis, MD  ibuprofen (ADVIL,MOTRIN) 800 MG tablet Take 800 mg by mouth 2 (two) times daily.    [provider]  lisinopril (PRINIVIL,ZESTRIL) 20 MG tablet Take 1 tablet (20 mg total) by mouth daily. 10/06/16 10/06/17  Sharman Cheek, MD  lisinopril (PRINIVIL,ZESTRIL) 20 MG tablet Take 1 tablet (20 mg total) by mouth daily. 12/07/16 03/07/17  Menshew, Charlesetta Ivory, PA-C  metoprolol succinate (TOPROL XL) 50 MG 24 hr tablet Take 1 tablet (50 mg total) by mouth daily. Take with  or immediately following a meal. 10/06/16 10/06/17  Sharman Cheek, MD  metoprolol succinate (TOPROL XL) 50 MG 24 hr tablet Take 1 tablet (50 mg total) by mouth daily. Take with or immediately following a meal. 12/07/16 03/07/17  Menshew, Charlesetta Ivory, PA-C  omeprazole (PRILOSEC) 20 MG capsule Take 1 capsule (20 mg total) by mouth daily. 09/01/17   Pasty Spillers, MD  oxyCODONE-acetaminophen (ROXICET) 5-325 MG tablet Take 1 tablet by mouth every 6 (six) hours as needed. Patient not taking: Reported on 09/01/2017 03/06/17   Minna Antis, MD  predniSONE (DELTASONE) 10 MG  tablet Take 1 tablet (10 mg total) by mouth daily. 6,5,4,3,2,1 six day taper 04/24/18   Evon Slack, PA-C  promethazine (PHENERGAN) 25 MG tablet Take 1 tablet (25 mg total) by mouth every 6 (six) hours as needed for nausea or vomiting. Patient not taking: Reported on 09/01/2017 03/06/17   Minna Antis, MD  ranitidine (ZANTAC) 150 MG tablet Take 1 tablet (150 mg total) by mouth 2 (two) times daily. 02/16/16 02/15/17  Willy Eddy, MD  tamsulosin (FLOMAX) 0.4 MG CAPS capsule Take 1 capsule (0.4 mg total) by mouth daily. 09/16/17   Minna Antis, MD    Family History No family history on file.  Social History Social History   Tobacco Use  . Smoking status: Never Smoker  . Smokeless tobacco: Never Used  Substance Use Topics  . Alcohol use: No  . Drug use: Yes    Types: Marijuana    Comment: occ     Allergies   Penicillins; Compazine [prochlorperazine edisylate]; Demerol [meperidine]; Reglan [metoclopramide]; Toradol [ketorolac tromethamine]; and Zofran [ondansetron hcl]   Review of Systems Review of Systems  Constitutional: Negative for activity change.  Eyes: Negative for pain and visual disturbance.  Respiratory: Negative for shortness of breath.   Cardiovascular: Negative for chest pain and leg swelling.  Gastrointestinal: Negative for abdominal pain.  Genitourinary: Negative for flank pain and pelvic pain.  Musculoskeletal: Positive for arthralgias. Negative for gait problem, joint swelling, myalgias, neck pain and neck stiffness.  Skin: Negative for wound.  Neurological: Negative for dizziness, syncope, weakness, light-headedness, numbness and headaches.  Psychiatric/Behavioral: Negative for confusion and decreased concentration.     Physical Exam Updated Vital Signs BP (!) 184/107   Pulse 81   Temp 98.6 F (37 C) (Oral)   Resp 18   Ht 5\' 4"  (1.626 m)   Wt 90.7 kg   SpO2 97%   BMI 34.33 kg/m   Physical Exam  Constitutional: She is oriented to  person, place, and time. She appears well-developed and well-nourished.  HENT:  Head: Normocephalic and atraumatic.  Right Ear: External ear normal.  Left Ear: External ear normal.  Nose: Nose normal.  Eyes: Pupils are equal, round, and reactive to light. Conjunctivae and EOM are normal.  Neck: Normal range of motion.  Cardiovascular: Normal rate.  Pulmonary/Chest: Effort normal and breath sounds normal. No respiratory distress.  Abdominal: Soft. There is no tenderness.  Musculoskeletal:  Examination of the right shoulder shows patient has mild anterior shoulder tenderness.  No subacromial space tenderness.  Patient is nontender along the clavicle or acromion.  Patient nontender along the anterior chest wall.  She has active abduction to 75 degrees passively she can be increased to 135 with a negative drop arm test.  Positive Hawkins and empty can test.  No swelling warmth erythema to the right upper extremity.  No deformity noted.  She is nontender throughout the cervical thoracic spinous  process to palpation.  She has good range of motion of cervical spine with negative Spurling's test.  Neurological: She is alert and oriented to person, place, and time. No cranial nerve deficit. Coordination normal.  Skin: Skin is warm and dry. No rash noted.  Psychiatric: She has a normal mood and affect. Her behavior is normal.     ED Treatments / Results  Labs (all labs ordered are listed, but only abnormal results are displayed) Labs Reviewed - No data to display  EKG None  Radiology Dg Shoulder Right  Result Date: 04/24/2018 CLINICAL DATA:  42 year old who states she has had multiple falls over the past 2 months resulting in a RIGHT shoulder injury. Initial encounter. EXAM: RIGHT SHOULDER - 2+ VIEW COMPARISON:  None. FINDINGS: No evidence of acute or subacute fracture or glenohumeral dislocation. Glenohumeral joint space well-preserved, though there is a spur involving the INFERIOR humeral  head. Acromioclavicular joint intact. Well-preserved bone mineral density. IMPRESSION: 1. No acute or subacute osseous abnormality. 2. Mild degenerative changes with spurring involving the humeral head. Electronically Signed   By: Hulan Saashomas  Lawrence M.D.   On: 04/24/2018 17:14    Procedures Procedures (including critical care time)  Medications Ordered in ED Medications  predniSONE (DELTASONE) tablet 60 mg (has no administration in time range)     Initial Impression / Assessment and Plan / ED Course  I have reviewed the triage vital signs and the nursing notes.  Pertinent labs & imaging results that were available during my care of the patient were reviewed by me and considered in my medical decision making (see chart for details).     42 year old female with right shoulder pain.  Initially injured 6 weeks ago and the most recently 10 days ago.  X-rays today show no evidence of acute bony abnormality.  She does have some mild glenohumeral arthropathy.  Physical exam concerning for rotator cuff tendinitis.  She is had no relief with over-the-counter medications, will start 6-day steroid taper.  She is noted to be hypertensive here in the emergency department, states she is taking her blood pressure medication today.  No vision changes, headaches, chest pain or shortness of breath.  She will continue to monitor blood pressure and follow-up with PCP if she continues to note elevation.  She understands signs and symptoms return to ED for.  Final Clinical Impressions(s) / ED Diagnoses   Final diagnoses:  Acute pain of right shoulder  Sprain of right rotator cuff capsule, initial encounter  Hypertension, unspecified type    ED Discharge Orders         Ordered    predniSONE (DELTASONE) 10 MG tablet  Daily     04/24/18 1731           Ronnette JuniperGaines, Kiron Osmun C, PA-C 04/24/18 1734    Jene EveryKinner, Robert, MD 04/24/18 1910

## 2018-04-24 NOTE — Discharge Instructions (Addendum)
Please continue taking blood pressure medication as prescribed.  He may continue with Tylenol 1000 mg every 6 hours as needed.  Please take prednisone as prescribed.  Call PCP and/or orthopedist to schedule follow-up appointment in regards to high blood pressure and right shoulder pain.  If any increasing shoulder pain, headaches, vision changes chest pain or shortness of breath return to the emergency department.

## 2018-04-24 NOTE — ED Triage Notes (Signed)
R shoulder pain began 6 weeks ago when dog pulled her. Fell 10 days ago same shoulder and pain worse since.

## 2018-09-04 ENCOUNTER — Other Ambulatory Visit: Payer: Self-pay

## 2018-09-04 ENCOUNTER — Encounter: Payer: Self-pay | Admitting: Emergency Medicine

## 2018-09-04 ENCOUNTER — Emergency Department: Payer: Self-pay

## 2018-09-04 ENCOUNTER — Emergency Department
Admission: EM | Admit: 2018-09-04 | Discharge: 2018-09-04 | Disposition: A | Discharge status: Home / Self Care | Payer: Self-pay | Attending: Emergency Medicine | Admitting: Emergency Medicine

## 2018-09-04 DIAGNOSIS — Z79899 Other long term (current) drug therapy: Secondary | ICD-10-CM | POA: Insufficient documentation

## 2018-09-04 DIAGNOSIS — E119 Type 2 diabetes mellitus without complications: Secondary | ICD-10-CM

## 2018-09-04 DIAGNOSIS — I10 Essential (primary) hypertension: Secondary | ICD-10-CM | POA: Insufficient documentation

## 2018-09-04 DIAGNOSIS — R079 Chest pain, unspecified: Secondary | ICD-10-CM

## 2018-09-04 HISTORY — DX: Calculus of kidney: N20.0

## 2018-09-04 LAB — CBC WITH DIFFERENTIAL/PLATELET
Abs Immature Granulocytes: 0.02 10*3/uL (ref 0.00–0.07)
Basophils Absolute: 0.1 10*3/uL (ref 0.0–0.1)
Basophils Relative: 1 %
Eosinophils Absolute: 0.2 10*3/uL (ref 0.0–0.5)
Eosinophils Relative: 2 %
HCT: 43.5 % (ref 36.0–46.0)
Hemoglobin: 15.5 g/dL — ABNORMAL HIGH (ref 12.0–15.0)
Immature Granulocytes: 0 %
Lymphocytes Relative: 45 %
Lymphs Abs: 3.1 10*3/uL (ref 0.7–4.0)
MCH: 31.9 pg (ref 26.0–34.0)
MCHC: 35.6 g/dL (ref 30.0–36.0)
MCV: 89.5 fL (ref 80.0–100.0)
Monocytes Absolute: 0.5 10*3/uL (ref 0.1–1.0)
Monocytes Relative: 7 %
Neutro Abs: 3.2 10*3/uL (ref 1.7–7.7)
Neutrophils Relative %: 45 %
Platelets: 228 10*3/uL (ref 150–400)
RBC: 4.86 MIL/uL (ref 3.87–5.11)
RDW: 11.9 % (ref 11.5–15.5)
WBC: 7 10*3/uL (ref 4.0–10.5)
nRBC: 0.3 % — ABNORMAL HIGH (ref 0.0–0.2)

## 2018-09-04 LAB — BRAIN NATRIURETIC PEPTIDE: B Natriuretic Peptide: 19 pg/mL (ref 0.0–100.0)

## 2018-09-04 LAB — TROPONIN I: Troponin I: <0.03 ng/mL (ref <0.03)

## 2018-09-04 LAB — BASIC METABOLIC PANEL
Anion gap: 10 (ref 5–15)
BUN: 10 mg/dL (ref 6–20)
CO2: 26 mmol/L (ref 22–32)
Calcium: 9.5 mg/dL (ref 8.9–10.3)
Chloride: 97 mmol/L — ABNORMAL LOW (ref 98–111)
Creatinine, Ser: 0.91 mg/dL (ref 0.44–1.00)
GFR calc Af Amer: >60 mL/min (ref >60)
GFR calc non Af Amer: >60 mL/min (ref >60)
Glucose, Bld: 423 mg/dL — ABNORMAL HIGH (ref 70–99)
Potassium: 4 mmol/L (ref 3.5–5.1)
Sodium: 133 mmol/L — ABNORMAL LOW (ref 135–145)

## 2018-09-04 LAB — GLUCOSE, CAPILLARY
Glucose-Capillary: 326 mg/dL — ABNORMAL HIGH (ref 70–99)
Glucose-Capillary: 277 mg/dL — ABNORMAL HIGH (ref 70–99)

## 2018-09-04 LAB — FIBRIN DERIVATIVES D-DIMER (ARMC ONLY): Fibrin derivatives D-dimer (ARMC): 339.67 ng/mL (FEU) (ref 0.00–499.00)

## 2018-09-04 MED ORDER — SODIUM CHLORIDE 0.9 % IV BOLUS
1000.0000 mL | Freq: Once | INTRAVENOUS | Status: AC
  Administered 2018-09-04: 1000 mL via INTRAVENOUS

## 2018-09-04 MED ORDER — LABETALOL HCL 5 MG/ML IV SOLN
10.0000 mg | Freq: Once | INTRAVENOUS | Status: AC
  Administered 2018-09-04: 07:00:00 10 mg via INTRAVENOUS
  Filled 2018-09-04: qty 4

## 2018-09-04 MED ORDER — FLUCONAZOLE 50 MG PO TABS
150.0000 mg | ORAL_TABLET | Freq: Once | ORAL | Status: AC
  Administered 2018-09-04: 150 mg via ORAL
  Filled 2018-09-04: qty 1

## 2018-09-04 MED ORDER — IBUPROFEN 600 MG PO TABS
600.0000 mg | ORAL_TABLET | Freq: Once | ORAL | Status: AC
  Administered 2018-09-04: 600 mg via ORAL
  Filled 2018-09-04: qty 1

## 2018-09-04 MED ORDER — METFORMIN HCL 500 MG PO TABS
500.0000 mg | ORAL_TABLET | Freq: Once | ORAL | Status: AC
  Administered 2018-09-04: 500 mg via ORAL
  Filled 2018-09-04: qty 1

## 2018-09-04 MED ORDER — DICYCLOMINE HCL 20 MG PO TABS: 20.0000 mg | ORAL_TABLET | Freq: Three times a day (TID) | ORAL | 0 refills | Status: DC | PRN

## 2018-09-04 MED ORDER — INSULIN ASPART 100 UNIT/ML ~~LOC~~ SOLN
10.0000 [IU] | Freq: Once | SUBCUTANEOUS | Status: AC
  Administered 2018-09-04: 10:00:00 10 [IU] via INTRAVENOUS
  Filled 2018-09-04: qty 1

## 2018-09-04 MED ORDER — CLONIDINE HCL 0.1 MG PO TABS: 0.1000 mg | ORAL_TABLET | Freq: Two times a day (BID) | ORAL | 11 refills | Status: DC

## 2018-09-04 MED ORDER — METFORMIN HCL 500 MG PO TABS: 500.0000 mg | ORAL_TABLET | Freq: Two times a day (BID) | ORAL | 1 refills | Status: DC

## 2018-09-04 NOTE — ED Provider Notes (Addendum)
[facility] Emergency Department Provider Note    First MD Initiated Contact with Patient 09/04/18 [phone]     (approximate)  I have reviewed the triage vital signs and the nursing notes.   HISTORY  Chief Complaint Chest Pain and Shortness of Breath    HPI Tina Brennan is a 43 y.o. female with medical history as listed below including hypertension pancreatitis irritable bowel presents to the emergency department with 5-day history of intermittent chest tightness and dyspnea.  Patient states in addition her blood pressure cuff at home continues to read error.  Patient states that she is taking her lisinopril and metoprolol as prescribed however she states that her blood pressure has been elevated for a while with systolic blood pressures in the 180s.  Patient denies any chest tightness at present however does admit to dyspnea.        Past Medical History:  Diagnosis Date  . Hypertension   . IBS (irritable bowel syndrome)   . Kidney stones   . Pancreatitis     There are no active problems to display for this patient.   Past Surgical History:  Procedure Laterality Date  . ABDOMINAL HYSTERECTOMY    . BLADDER SURGERY    . cyst removal of ovaries      Prior to Admission medications   Medication Sig Start Date End Date Taking? Authorizing Provider  amLODipine (NORVASC) 5 MG tablet Take 1 tablet (5 mg total) by mouth daily. 12/07/16 03/07/17  Menshew, Charlesetta Ivory, PA-C  ciprofloxacin (CIPRO) 500 MG tablet Take 1 tablet (500 mg total) by mouth 2 (two) times daily. Patient not taking: Reported on 09/01/2017 05/22/16   Jene Every, MD  cloNIDine (CATAPRES) 0.1 MG tablet Take 1 tablet (0.1 mg total) by mouth 2 (two) times daily. 05/22/16 05/22/17  Jene Every, MD  cyclobenzaprine (FLEXERIL) 5 MG tablet Take 1 tablet (5 mg total) by mouth 3 (three) times daily as needed for muscle spasms. Patient not taking: Reported on 05/22/2016 02/16/16   Willy Eddy, MD  dicyclomine (BENTYL) 20 MG tablet Take 1 tablet (20 mg total) by mouth 3 (three) times daily as needed for spasms. 10/06/16   Sharman Cheek, MD  dicyclomine (BENTYL) 20 MG tablet Take 1 tablet (20 mg total) by mouth 3 (three) times daily as needed for spasms. 12/07/16 02/05/17  Menshew, Charlesetta Ivory, PA-C  HYDROcodone-acetaminophen (NORCO/VICODIN) 5-325 MG tablet Take 1 tablet by mouth every 4 (four) hours as needed. 09/16/17   Minna Antis, MD  ibuprofen (ADVIL,MOTRIN) 800 MG tablet Take 800 mg by mouth 2 (two) times daily.    [provider]  lisinopril (PRINIVIL,ZESTRIL) 20 MG tablet Take 1 tablet (20 mg total) by mouth daily. 10/06/16 10/06/17  Sharman Cheek, MD  lisinopril (PRINIVIL,ZESTRIL) 20 MG tablet Take 1 tablet (20 mg total) by mouth daily. 12/07/16 03/07/17  Menshew, Charlesetta Ivory, PA-C  metoprolol succinate (TOPROL XL) 50 MG 24 hr tablet Take 1 tablet (50 mg total) by mouth daily. Take with or immediately following a meal. 10/06/16 10/06/17  Sharman Cheek, MD  metoprolol succinate (TOPROL XL) 50 MG 24 hr tablet Take 1 tablet (50 mg total) by mouth daily. Take with or immediately following a meal. 12/07/16 03/07/17  Menshew, Charlesetta Ivory, PA-C  omeprazole (PRILOSEC) 20 MG capsule Take 1 capsule (20 mg total) by mouth daily. 09/01/17   Pasty Spillers, MD  oxyCODONE-acetaminophen (ROXICET) 5-325 MG tablet Take 1 tablet by mouth every 6 (six)  hours as needed. Patient not taking: Reported on 09/01/2017 03/06/17   Minna Antis, MD  predniSONE (DELTASONE) 10 MG tablet Take 1 tablet (10 mg total) by mouth daily. 6,5,4,3,2,1 six day taper 04/24/18   Evon Slack, PA-C  promethazine (PHENERGAN) 25 MG tablet Take 1 tablet (25 mg total) by mouth every 6 (six) hours as needed for nausea or vomiting. Patient not taking: Reported on 09/01/2017 03/06/17   Minna Antis, MD  ranitidine (ZANTAC) 150 MG tablet Take 1 tablet (150 mg total) by mouth 2 (two) times daily.  02/16/16 02/15/17  Willy Eddy, MD  tamsulosin (FLOMAX) 0.4 MG CAPS capsule Take 1 capsule (0.4 mg total) by mouth daily. 09/16/17   Minna Antis, MD    Allergies Penicillins; Compazine [prochlorperazine edisylate]; Demerol [meperidine]; Reglan [metoclopramide]; Toradol [ketorolac tromethamine]; and Zofran [ondansetron hcl]  No family history on file.  Social History Social History   Tobacco Use  . Smoking status: Never Smoker  . Smokeless tobacco: Never Used  Substance Use Topics  . Alcohol use: No  . Drug use: Yes    Types: Marijuana    Comment: occ    Review of Systems Constitutional: No fever/chills Eyes: No visual changes. ENT: No sore throat. Cardiovascular: Positive chest pain. Respiratory: Positive for shortness of breath. Gastrointestinal: No abdominal pain.  No nausea, no vomiting.  No diarrhea.  No constipation. Genitourinary: Negative for dysuria. Musculoskeletal: Negative for neck pain.  Negative for back pain. Integumentary: Negative for rash. Neurological: Negative for headaches, focal weakness or numbness.   ____________________________________________   PHYSICAL EXAM:  VITAL SIGNS: ED Triage Vitals  Enc Vitals Group     BP 09/04/18 0539 (!) 209/135     Pulse Rate 09/04/18 0539 (!) 105     Resp 09/04/18 0539 18     Temp 09/04/18 0539 98.2 F (36.8 C)     Temp Source 09/04/18 0539 Oral     SpO2 09/04/18 0539 95 %     Weight 09/04/18 0536 90.3 kg (199 lb)     Height 09/04/18 0536 1.626 m ( )     Head Circumference --      Peak Flow --      Pain Score 09/04/18 0536 6     Pain Loc --      Pain Edu? --      Excl. in GC? --     Constitutional: Alert and oriented. Well appearing and in no acute distress. Eyes: Conjunctivae are normal. PERRL. EOMI. Head: Atraumatic. Ears:  Healthy appearing ear canals and TMs bilaterally Nose: No congestion/rhinnorhea. Mouth/Throat: Mucous membranes are moist.  Oropharynx non-erythematous. Neck:  No stridor.   Cardiovascular: Normal rate, regular rhythm. Good peripheral circulation. Grossly normal heart sounds. Respiratory: Normal respiratory effort.  No retractions. Lungs CTAB. Gastrointestinal: Soft and nontender. No distention.  Musculoskeletal: No lower extremity tenderness nor edema. No gross deformities of extremities. Neurologic:  Normal speech and language. No gross focal neurologic deficits are appreciated.  Skin:  Skin is warm, dry and intact. No rash noted. Psychiatric: Mood and affect are normal. Speech and behavior are normal.  ____________________________________________   LABS (all labs ordered are listed, but only abnormal results are displayed)  Labs Reviewed - No data to display ____________________________________________   Procedures   ____________________________________________   INITIAL IMPRESSION / MDM / ASSESSMENT AND PLAN / ED COURSE  As part of my medical decision making, I reviewed the following data within the electronic MEDICAL RECORD NUMBER   Tina Brennan was evaluated  in Emergency Department on 09/04/2018 for the symptoms described in the history of present illness. She was evaluated in the context of the global COVID-19 pandemic, which necessitated consideration that the patient might be at risk for infection with the SARS-CoV-2 virus that causes COVID-19. Institutional protocols and algorithms that pertain to the evaluation of patients at risk for COVID-19 are in a state of rapid change based on information released by regulatory bodies including the CDC and federal and state organizations. These policies and algorithms were followed during the patient's care in the ED.    43 year old female presenting with above-stated history and physical exam secondary to chest discomfort intermittently with dyspnea.  Patient noted to be hypertensive on arrival to the emergency department and was such given labetalol 10 mg IV with improvement of blood pressure  current blood pressure is 148/100.  Patient noted to be markedly hyperglycemic and blood work with a glucose of 423.  Patient given 2 L IV normal saline patient's care transferred to Dr. Don Perking  ____________________________________________  FINAL CLINICAL IMPRESSION(S) / ED DIAGNOSES  Final diagnoses:  Chest pain, unspecified type  Essential hypertension     MEDICATIONS GIVEN DURING THIS VISIT:  Medications - No data to display   ED Discharge Orders    None       Note:  This document was prepared using Dragon voice recognition software and may include unintentional dictation errors.   Darci Current, MD 09/04/18 5573    Darci Current, MD 09/04/18 2202    Darci Current, MD 09/04/18 [phone]

## 2018-09-04 NOTE — ED Provider Notes (Signed)
_________________________ 10:56 AM on 09/04/2018 -----------------------------------------  Blood pressure improved significantly.  Blood glucose as well.  New onset of diabetes.  Patient be discharged home on metformin, discussed diabetic diet, close follow-up with PCP.  Patient also given a glucose monitor for home.  Clonidine was added to her regime for hypertension.  Work-up essentially unremarkable with normal d-dimer, chest x-ray, troponin and EKG.   Don Perking, [place], MD 09/04/18 1057

## 2018-09-04 NOTE — ED Notes (Signed)
Report to amber, rn.  

## 2018-09-04 NOTE — ED Notes (Signed)
Erie Noe, rn in to attempt iv insertion x2 without success. John, rn in to attempt US guided iv insertion.

## 2018-09-04 NOTE — ED Triage Notes (Signed)
Patient states that she has had central chest tightness with some shortness of breath times 5 days.

## 2018-09-04 NOTE — ED Notes (Signed)
Patient given warm blanket and graham crackers

## 2018-09-23 ENCOUNTER — Other Ambulatory Visit: Payer: Self-pay

## 2018-09-23 ENCOUNTER — Encounter: Payer: Self-pay | Admitting: Adult Health

## 2018-09-23 ENCOUNTER — Ambulatory Visit: Payer: Self-pay | Admitting: Adult Health

## 2018-09-23 VITALS — BP 180/110 | Wt 197.0 lb

## 2018-09-23 DIAGNOSIS — Z6833 Body mass index (BMI) 33.0-33.9, adult: Secondary | ICD-10-CM | POA: Insufficient documentation

## 2018-09-23 DIAGNOSIS — G44019 Episodic cluster headache, not intractable: Secondary | ICD-10-CM

## 2018-09-23 DIAGNOSIS — H538 Other visual disturbances: Secondary | ICD-10-CM | POA: Insufficient documentation

## 2018-09-23 DIAGNOSIS — E11319 Type 2 diabetes mellitus with unspecified diabetic retinopathy without macular edema: Secondary | ICD-10-CM

## 2018-09-23 DIAGNOSIS — R519 Headache, unspecified: Secondary | ICD-10-CM | POA: Insufficient documentation

## 2018-09-23 DIAGNOSIS — K589 Irritable bowel syndrome without diarrhea: Secondary | ICD-10-CM | POA: Insufficient documentation

## 2018-09-23 DIAGNOSIS — I10 Essential (primary) hypertension: Secondary | ICD-10-CM

## 2018-09-23 DIAGNOSIS — R51 Headache: Secondary | ICD-10-CM

## 2018-09-23 DIAGNOSIS — Z0000 Encounter for general adult medical examination without abnormal findings: Secondary | ICD-10-CM | POA: Insufficient documentation

## 2018-09-23 MED ORDER — CLONIDINE HCL 0.1 MG PO TABS: 0.1000 mg | ORAL_TABLET | Freq: Two times a day (BID) | ORAL | 11 refills | Status: DC

## 2018-09-23 MED ORDER — METOPROLOL TARTRATE 50 MG PO TABS: 50.0000 mg | ORAL_TABLET | Freq: Two times a day (BID) | ORAL | 2 refills | Status: DC

## 2018-09-23 NOTE — Progress Notes (Signed)
Patient ID: Tina Brennan, female   DOB: November 07, 1975, 43 y.o.   MRN: [id number]  Chief Complaint  Patient presents with  . Diabetes    BS fasting 113  . Blurred Vision    seems to happen overnight    HPI Tina Brennan is a 43 y.o. female seen via televisit for an initial encounter and evaluation of newly diagnosed Type 2 Diabetes Mellitus, hypertension and blurred vision. She was diagnosed with Diabetes early this month. She was started on metformin 5110m bid. She reports mild diarrhea when she takes it without food. Blood glucose levels are less than 1554mdl. She reports eating only one meal a day due to lack of food. Her BP remains high with systolic readings ranging between 14703-500nd diastolic readings 9993-818She is on metoprolol 5016mid and clonidine 0.1mg71mily. She reports a headache and blurred vision x 10 days. No associated photophobia, nausea, vomiting, and dizziness. She takes prn motrin for pain. Blurred vision occurred suddenly and has not changed. No associated eye pain, discharge or itching. She has not had a diabetic eye exam. She has a h/o low back pain and takes prn muscle relaxants for it.  She denies chest pain, palpitations, dyspnea and wheezing    Past Medical History:  Diagnosis Date  . Hypertension   . IBS (irritable bowel syndrome)   . Kidney stones   . Pancreatitis     Past Surgical History:  Procedure Laterality Date  . ABDOMINAL HYSTERECTOMY    . BLADDER SURGERY    . cyst removal of ovaries      No family history on file.  Social History Social History   Tobacco Use  . Smoking status: Never Smoker  . Smokeless tobacco: Never Used  Substance Use Topics  . Alcohol use: No  . Drug use: Yes    Types: Marijuana    Comment: occ    Allergies  Allergen Reactions  . Penicillins Anaphylaxis  . Compazine [Prochlorperazine Edisylate] Other (See Comments)    Dystonia   . Demerol [Meperidine] Hives and Other (See Comments)    Dystonia and  Fever Blisters.   . Reglan [Metoclopramide] Other (See Comments)    Dystonia   . Toradol [Ketorolac Tromethamine] Other (See Comments)    Dystonia   . Zofran [Ondansetron Hcl]     Dystonia     Current Outpatient Medications  Medication Sig Dispense Refill  . metFORMIN (GLUCOPHAGE) 500 MG tablet Take 1 tablet (500 mg total) by mouth 2 (two) times daily with a meal. 60 tablet 1  . amLODipine (NORVASC) 5 MG tablet Take 1 tablet (5 mg total) by mouth daily. 30 tablet 2  . ciprofloxacin (CIPRO) 500 MG tablet Take 1 tablet (500 mg total) by mouth 2 (two) times daily. (Patient not taking: Reported on 09/01/2017) 14 tablet 0  . cloNIDine (CATAPRES) 0.1 MG tablet Take 1 tablet (0.1 mg total) by mouth 2 (two) times daily. 60 tablet 11  . cyclobenzaprine (FLEXERIL) 5 MG tablet Take 1 tablet (5 mg total) by mouth 3 (three) times daily as needed for muscle spasms. (Patient not taking: Reported on 05/22/2016) 12 tablet 0  . dicyclomine (BENTYL) 20 MG tablet Take 1 tablet (20 mg total) by mouth 3 (three) times daily as needed for spasms. 60 tablet 0  . HYDROcodone-acetaminophen (NORCO/VICODIN) 5-325 MG tablet Take 1 tablet by mouth every 4 (four) hours as needed. (Patient not taking: Reported on 09/23/2018) 8 tablet 0  . ibuprofen (ADVIL,MOTRIN) 800 MG tablet Take  800 mg by mouth 2 (two) times daily.    Marland Kitchen lisinopril (PRINIVIL,ZESTRIL) 20 MG tablet Take 1 tablet (20 mg total) by mouth daily. 30 tablet 2  . lisinopril (PRINIVIL,ZESTRIL) 20 MG tablet Take 1 tablet (20 mg total) by mouth daily. 30 tablet 2  . metoprolol succinate (TOPROL XL) 50 MG 24 hr tablet Take 1 tablet (50 mg total) by mouth daily. Take with or immediately following a meal. 30 tablet 2  . metoprolol succinate (TOPROL XL) 50 MG 24 hr tablet Take 1 tablet (50 mg total) by mouth daily. Take with or immediately following a meal. 30 tablet 2  . omeprazole (PRILOSEC) 20 MG capsule Take 1 capsule (20 mg total) by mouth daily. 30 capsule 1  .  oxyCODONE-acetaminophen (ROXICET) 5-325 MG tablet Take 1 tablet by mouth every 6 (six) hours as needed. (Patient not taking: Reported on 09/01/2017) 20 tablet 0  . predniSONE (DELTASONE) 10 MG tablet Take 1 tablet (10 mg total) by mouth daily. 6,5,4,3,2,1 six day taper (Patient not taking: Reported on 09/23/2018) 21 tablet 0  . promethazine (PHENERGAN) 25 MG tablet Take 1 tablet (25 mg total) by mouth every 6 (six) hours as needed for nausea or vomiting. (Patient not taking: Reported on 09/01/2017) 20 tablet 0  . ranitidine (ZANTAC) 150 MG tablet Take 1 tablet (150 mg total) by mouth 2 (two) times daily. 60 tablet 1  . tamsulosin (FLOMAX) 0.4 MG CAPS capsule Take 1 capsule (0.4 mg total) by mouth daily. (Patient not taking: Reported on 09/23/2018) 30 capsule 0   No current facility-administered medications for this visit.     Review of Systems Review of Systems  Constitutional: Negative for appetite change, chills, fatigue and fever.  HENT: Negative for congestion, sinus pressure, sinus pain, sneezing and sore throat.   Eyes: Positive for visual disturbance (BLURRED VISION). Negative for photophobia, pain, discharge and redness.  Respiratory: Negative for cough, chest tightness, shortness of breath and wheezing.   Cardiovascular: Negative for chest pain, palpitations and leg swelling.  Gastrointestinal: Negative for abdominal pain and constipation.  Endocrine: Positive for polydipsia. Negative for polyuria.  Genitourinary: Negative for dysuria and flank pain.  Musculoskeletal: Negative for back pain and myalgias.  Skin: Negative for rash.    Blood pressure (!) 180/110, weight 197 lb (89.4 kg).  Physical Exam Physical Exam Vitals reviewed: No physical exam as this was a tele visit. BP 143/103, FS 113MG/DL.     Data Reviewed Will obtain routine labs in 2 months  Assessment and plan 1. 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 Continue metformin -Patient will need a diabetic eye exam, food exam and repeat HgA1C and Lipid panel in 2 months -Continue to monitor blood glucose and keep a log of readings -S/SX of hypoglycemia and hyperglycemia reviewed  2. Uncontrolled hypertension -Continue metoprolol 48m bid and increased clonidine to bid -Monitor BP twice a day and keep a log of readings. Will review in 1 week and adjust medications if needed  3. Irritable bowel syndrome, unspecified type -Continue current medications  4. Episodic cluster headache, not intractable -Likely due to uncontrolled HTN. Continue BP control and prn motrin and tylenol  5. Blurred vision, bilateral -Due to diabetes and uncontrolled HTN. Will refer to ophthalmology for evaluation -Patient advised to go to the ER if acute eye pain develops  6. Healthcare maintenance - Comp Met (CMET); Future -  HgB A1c; Future - Lipid Profile; Future - TSH; Future  7. BMI 33.0-33.9,adult Weight loss advice given  F/u in 1 week  Tina Brennan 09/23/2018, 12:52 PM

## 2018-09-23 NOTE — Addendum Note (Signed)
Addended by: Andreas Ohm on: 09/23/2018 03:34 PM   Modules accepted: Orders

## 2018-09-30 ENCOUNTER — Encounter: Payer: Self-pay | Admitting: Adult Health

## 2018-09-30 ENCOUNTER — Ambulatory Visit: Payer: Self-pay | Admitting: Adult Health

## 2018-09-30 VITALS — BP 126/74

## 2018-09-30 DIAGNOSIS — G44019 Episodic cluster headache, not intractable: Secondary | ICD-10-CM

## 2018-09-30 DIAGNOSIS — E11319 Type 2 diabetes mellitus with unspecified diabetic retinopathy without macular edema: Secondary | ICD-10-CM

## 2018-09-30 DIAGNOSIS — I10 Essential (primary) hypertension: Secondary | ICD-10-CM

## 2018-09-30 DIAGNOSIS — K219 Gastro-esophageal reflux disease without esophagitis: Secondary | ICD-10-CM

## 2018-09-30 MED ORDER — CLONIDINE HCL 0.1 MG PO TABS: 0.1000 mg | ORAL_TABLET | Freq: Three times a day (TID) | ORAL | 3 refills | Status: DC

## 2018-09-30 MED ORDER — METFORMIN HCL 500 MG PO TABS: 500.0000 mg | ORAL_TABLET | Freq: Two times a day (BID) | ORAL | 1 refills | Status: DC

## 2018-09-30 MED ORDER — ASPIRIN-ACETAMINOPHEN-CAFFEINE 250-250-65 MG PO TABS: 2.0000 | ORAL_TABLET | Freq: Four times a day (QID) | ORAL | 3 refills | Status: DC | PRN

## 2018-09-30 MED ORDER — OMEPRAZOLE 20 MG PO CPDR: 20.0000 mg | DELAYED_RELEASE_CAPSULE | Freq: Every day | ORAL | 1 refills | Status: AC

## 2018-09-30 MED ORDER — METOPROLOL TARTRATE 50 MG PO TABS: 50.0000 mg | ORAL_TABLET | Freq: Two times a day (BID) | ORAL | 2 refills | Status: DC

## 2018-09-30 MED ORDER — DICYCLOMINE HCL 20 MG PO TABS: 20.0000 mg | ORAL_TABLET | Freq: Three times a day (TID) | ORAL | 3 refills | Status: DC | PRN

## 2018-09-30 NOTE — Progress Notes (Signed)
Patient: Tina Brennan Female    DOB: Apr 18, 1976   43 y.o.   MRN: [id number] Visit Date: 09/30/2018  Today's Provider: Shawn RouteMagddalene S Tukov-Yual, NP   Chief Complaint  Patient presents with  . Diabetes  . Hypertension  . Headache    pounding headache daily  . Shortness of Breath    everyday   Subjective:    HPI This is a 43 y/o female seen via Telephonic visit for f/u hypertension, diabetes and headache.  She reports improvement in blood glucose level.  Fasting levels are less than 150 mg/dL and postprandial levels are less than 200 mg/dL.  Her blood pressures are much better but she still reports occasional high readings.  She was started on clonidine 0.1 mg twice daily during her last visit.  She reports taking all medications as prescribed.  She reports persistent headache which she describes as pounding on the left side of her head.  She has taken ibuprofen without any significant relief.  She still has persistent dyspnea which she states that occurs at any time.  No associated symptoms with dyspnea besides the headache.  She is also complaining of blurred vision that has not improved with improved glycemic control.  She denies chest pain, palpitations, nausea and vomiting.  She denies any medication side effects.   Allergies  Allergen Reactions  . Penicillins Anaphylaxis  . Compazine [Prochlorperazine Edisylate] Other (See Comments)    Dystonia   . Demerol [Meperidine] Hives and Other (See Comments)    Dystonia and Fever Blisters.   . Reglan [Metoclopramide] Other (See Comments)    Dystonia   . Toradol [Ketorolac Tromethamine] Other (See Comments)    Dystonia   . Zofran [Ondansetron Hcl]     Dystonia    Previous Medications   CLONIDINE (CATAPRES) 0.1 MG TABLET    Take 1 tablet (0.1 mg total) by mouth 2 (two) times daily.   DICYCLOMINE (BENTYL) 20 MG TABLET    Take 1 tablet (20 mg total) by mouth 3 (three) times daily as needed for spasms.   IBUPROFEN (ADVIL,MOTRIN) 800  MG TABLET    Take 800 mg by mouth 2 (two) times daily.   METFORMIN (GLUCOPHAGE) 500 MG TABLET    Take 1 tablet (500 mg total) by mouth 2 (two) times daily with a meal.   METOPROLOL TARTRATE (LOPRESSOR) 50 MG TABLET    Take 1 tablet (50 mg total) by mouth 2 (two) times daily.   OMEPRAZOLE (PRILOSEC) 20 MG CAPSULE    Take 1 capsule (20 mg total) by mouth daily.    Review of Systems  Constitutional: Negative.   Eyes: Positive for visual disturbance.  Respiratory: Positive for shortness of breath. Negative for cough and wheezing.   Cardiovascular: Negative.   Gastrointestinal: Negative.   Endocrine: Negative.   Musculoskeletal: Negative.   Skin: Negative.   Neurological:       Reports headaches    Social History   Tobacco Use  . Smoking status: Never Smoker  . Smokeless tobacco: Never Used  Substance Use Topics  . Alcohol use: No   Objective:   BP 126/74   Physical Exam  Unable to obtain as this was a telephone visit    Assessment & Plan:  1. Gastroesophageal reflux disease without esophagitis - omeprazole (PRILOSEC) 20 MG capsule; Take 1 capsule (20 mg total) by mouth daily.  Dispense: 30 capsule; Refill: 1  2. 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  Will obtain routine labs  3. Episodic cluster headache, not intractable Likely due to uncontrolled hypertension but the possibility of migraine headache cannot be ruled out. Stop Motrin 800 mg Start Excedrin Migraine 2 tablets every 6 hours as needed Patient advised to stay hydrated and avoid triggers.  Patient advised to go to the emergency room for worsening symptoms Will consider neurology referral if symptoms persist.  4. Uncontrolled hypertension Blood pressure readings are still elevated and most likely cause of her headaches. Increase clonidine to 0.1 mg 3  times daily.  Monitor blood pressures 3 times a day and keep a log.  Call the clinic if blood pressure readings are consistently greater than 140/80.    Shawn Route, NP   [facility]

## 2018-09-30 NOTE — Progress Notes (Signed)
BP was 150/130 this morning when patient first woke up.  After meds the BP was normal.

## 2018-10-03 ENCOUNTER — Encounter: Payer: Self-pay | Admitting: Adult Health

## 2018-10-07 ENCOUNTER — Ambulatory Visit: Payer: Self-pay | Admitting: Urology

## 2018-10-07 ENCOUNTER — Other Ambulatory Visit: Payer: Self-pay

## 2018-10-07 DIAGNOSIS — G43901 Migraine, unspecified, not intractable, with status migrainosus: Secondary | ICD-10-CM | POA: Insufficient documentation

## 2018-10-07 DIAGNOSIS — I10 Essential (primary) hypertension: Secondary | ICD-10-CM | POA: Insufficient documentation

## 2018-10-07 DIAGNOSIS — E11319 Type 2 diabetes mellitus with unspecified diabetic retinopathy without macular edema: Secondary | ICD-10-CM

## 2018-10-07 MED ORDER — CLONIDINE HCL 0.1 MG PO TABS: 0.2000 mg | ORAL_TABLET | Freq: Three times a day (TID) | ORAL | 3 refills | Status: AC

## 2018-10-07 MED ORDER — DICYCLOMINE HCL 20 MG PO TABS: 20.0000 mg | ORAL_TABLET | Freq: Three times a day (TID) | ORAL | 3 refills | Status: DC

## 2018-10-07 NOTE — Progress Notes (Signed)
Virtual Visit via Telephone Note  I connected with Florene Glen on 10/07/2018 at 1841 by telephone and verified that I am speaking with the correct person using two identifiers.  They are located at home.  I am located at my home.    This visit type was conducted due to national recommendations for restrictions regarding the COVID-19 Pandemic (e.g. social distancing).  This format is felt to be most appropriate for this patient at this time.  All issues noted in this document were discussed and addressed.  No physical exam was performed.   I discussed the limitations, risks, security and privacy concerns of performing an evaluation and management service by telephone and the availability of in person appointments. I also discussed with the patient that there may be a patient responsible charge related to this service. The patient expressed understanding and agreed to proceed.   History of Present Illness: Mrs. Osthoff is a 43 year old female with DM, GERD, migraines and HTN who is contacted for a follow up visit.    She states that she is not having perspiration but instead her hands are sticky.   She has also has been experiencing dry mouth.  She is having sugars 60-120 in the am.  160-300 in the afternoon and sometimes she does not eat.  120-140 before dinner.    Her BP has been 125-170/86-114.  She is still having headaches.       Observations/Objective: She does not sound distressed.  She is answering questions appropriately.    Assessment and Plan: 1. DM - will add a mid day snack and continue to check sugars - follow up labs ordered  2. HTN - will increase her clonidine to 0.2 mg TID   Follow Up Instructions:   Will get labs and have a follow up visit to discuss lab result and BS and BP.     I discussed the assessment and treatment plan with the patient. The patient was provided an opportunity to ask questions and all were answered. The patient agreed with the plan and  demonstrated an understanding of the instructions.   The patient was advised to call back or seek an in-person evaluation if the symptoms worsen or if the condition fails to improve as anticipated.  I provided 30 minutes of non-face-to-face time during this encounter.   Elzia Hott, PA-C

## 2018-10-14 ENCOUNTER — Ambulatory Visit: Payer: Self-pay | Admitting: Adult Health

## 2018-10-14 ENCOUNTER — Other Ambulatory Visit: Payer: Self-pay

## 2018-10-21 ENCOUNTER — Other Ambulatory Visit: Payer: Self-pay

## 2018-10-21 DIAGNOSIS — E11319 Type 2 diabetes mellitus with unspecified diabetic retinopathy without macular edema: Secondary | ICD-10-CM

## 2018-10-21 DIAGNOSIS — I10 Essential (primary) hypertension: Secondary | ICD-10-CM

## 2018-10-22 LAB — LIPID PANEL
Chol/HDL Ratio: 6.5 ratio — ABNORMAL HIGH (ref 0.0–4.4)
Cholesterol, Total: 235 mg/dL — ABNORMAL HIGH (ref 100–199)
HDL: 36 mg/dL — ABNORMAL LOW (ref >39)
LDL Calculated: 160 mg/dL — ABNORMAL HIGH (ref 0–99)
Triglycerides: 195 mg/dL — ABNORMAL HIGH (ref 0–149)
VLDL Cholesterol Cal: 39 mg/dL (ref 5–40)

## 2018-10-22 LAB — COMPREHENSIVE METABOLIC PANEL
ALT: 34 IU/L — ABNORMAL HIGH (ref 0–32)
AST: 41 IU/L — ABNORMAL HIGH (ref 0–40)
Albumin/Globulin Ratio: 1.8 (ref 1.2–2.2)
Albumin: 4.6 g/dL (ref 3.8–4.8)
Alkaline Phosphatase: 110 IU/L (ref 39–117)
BUN/Creatinine Ratio: 17 (ref 9–23)
BUN: 14 mg/dL (ref 6–24)
Bilirubin Total: 0.3 mg/dL (ref 0.0–1.2)
CO2: 21 mmol/L (ref 20–29)
Calcium: 10 mg/dL (ref 8.7–10.2)
Chloride: 102 mmol/L (ref 96–106)
Creatinine, Ser: 0.82 mg/dL (ref 0.57–1.00)
GFR calc Af Amer: 100 mL/min/{1.73_m2} (ref >59)
GFR calc non Af Amer: 87 mL/min/{1.73_m2} (ref >59)
Globulin, Total: 2.6 g/dL (ref 1.5–4.5)
Glucose: 100 mg/dL — ABNORMAL HIGH (ref 65–99)
Potassium: 3.9 mmol/L (ref 3.5–5.2)
Sodium: 140 mmol/L (ref 134–144)
Total Protein: 7.2 g/dL (ref 6.0–8.5)

## 2018-10-22 LAB — HEMOGLOBIN A1C
Est. average glucose Bld gHb Est-mCnc: 194 mg/dL
Hgb A1c MFr Bld: 8.4 % — ABNORMAL HIGH (ref 4.8–5.6)

## 2018-10-22 LAB — TSH: TSH: 1.75 u[IU]/mL (ref 0.450–4.500)

## 2018-11-04 ENCOUNTER — Ambulatory Visit: Payer: Self-pay | Admitting: Urology

## 2018-11-04 ENCOUNTER — Encounter: Payer: Self-pay | Admitting: Urology

## 2018-11-04 ENCOUNTER — Other Ambulatory Visit: Payer: Self-pay

## 2018-11-04 VITALS — BP 178/128

## 2018-11-04 DIAGNOSIS — E11319 Type 2 diabetes mellitus with unspecified diabetic retinopathy without macular edema: Secondary | ICD-10-CM

## 2018-11-04 DIAGNOSIS — I10 Essential (primary) hypertension: Secondary | ICD-10-CM

## 2018-11-04 MED ORDER — METOPROLOL SUCCINATE ER 100 MG PO TB24: 100.0000 mg | ORAL_TABLET | Freq: Every day | ORAL | 0 refills | Status: DC

## 2018-11-04 MED ORDER — DICYCLOMINE HCL 20 MG PO TABS: 20.0000 mg | ORAL_TABLET | Freq: Three times a day (TID) | ORAL | 3 refills | Status: DC

## 2018-11-04 NOTE — Progress Notes (Signed)
Virtual Visit via Telephone Note  I connected with Tina Brennan on 11/04/18 at  6:30 PM EDT by telephone and verified that I am speaking with the correct person using two identifiers.  Location: Patient: home Provider: [facility]    I discussed the limitations, risks, security and privacy concerns of performing an evaluation and management service by telephone and the availability of in person appointments. I also discussed with the patient that there may be a patient responsible charge related to this service. The patient expressed understanding and agreed to proceed.   History of Present Illness: Uncontrolled DM  Encouraged to add a mid-day snack  BS 90's-120's - highest 140's   Uncontrolled HTN  Increased clonidine 0.2 mg to TID    Observations/Objective: Does not sound distressed and answers questions appropriately.   Assessment and Plan:  1. DM  - under better control  2. HTN  - will start metoprolol XR 100 mg and wean off the clonidine   3. Eye exam  - will arrange once eye services are set back up    Follow Up Instructions:  Fasting lipids and office visit in one month.     I discussed the assessment and treatment plan with the patient. The patient was provided an opportunity to ask questions and all were answered. The patient agreed with the plan and demonstrated an understanding of the instructions.   The patient was advised to call back or seek an in-person evaluation if the symptoms worsen or if the condition fails to improve as anticipated.  I provided 10 minutes of non-face-to-face time during this encounter.   Daschel Roughton, PA-C

## 2018-11-25 ENCOUNTER — Other Ambulatory Visit: Payer: Self-pay

## 2018-11-25 DIAGNOSIS — I10 Essential (primary) hypertension: Secondary | ICD-10-CM

## 2018-11-25 NOTE — Progress Notes (Signed)
Ordered fasting lipids from 11/04/2018 visit.

## 2018-12-01 ENCOUNTER — Telehealth: Payer: Self-pay | Admitting: Pharmacy Technician

## 2018-12-01 NOTE — Telephone Encounter (Signed)
Patient failed to provide requested financial documentation.  Financial documentation is required in order to determine patient's eligibility for [facility]'s program.  No additional medication assistance will be provided until patient provides requested financial documentation.  Patient notified by letter.  Siara Gorder Pharmacy Technician/Eligibility Specialist [facility] 

## 2018-12-07 ENCOUNTER — Other Ambulatory Visit: Payer: Self-pay

## 2018-12-07 MED ORDER — METFORMIN HCL 500 MG PO TABS: 500.0000 mg | ORAL_TABLET | Freq: Two times a day (BID) | ORAL | 1 refills | Status: DC

## 2018-12-09 ENCOUNTER — Other Ambulatory Visit: Payer: Self-pay

## 2018-12-16 ENCOUNTER — Ambulatory Visit: Payer: Self-pay

## 2018-12-21 ENCOUNTER — Other Ambulatory Visit: Payer: Self-pay

## 2018-12-21 ENCOUNTER — Emergency Department: Payer: Self-pay

## 2018-12-21 ENCOUNTER — Encounter: Payer: Self-pay | Admitting: Emergency Medicine

## 2018-12-21 ENCOUNTER — Emergency Department
Admission: EM | Admit: 2018-12-21 | Discharge: 2018-12-21 | Disposition: A | Discharge status: Home / Self Care | Payer: Self-pay | Attending: Emergency Medicine | Admitting: Emergency Medicine

## 2018-12-21 DIAGNOSIS — I10 Essential (primary) hypertension: Secondary | ICD-10-CM | POA: Insufficient documentation

## 2018-12-21 DIAGNOSIS — N130 Hydronephrosis with ureteropelvic junction obstruction: Secondary | ICD-10-CM | POA: Insufficient documentation

## 2018-12-21 DIAGNOSIS — N200 Calculus of kidney: Secondary | ICD-10-CM

## 2018-12-21 DIAGNOSIS — Z79899 Other long term (current) drug therapy: Secondary | ICD-10-CM | POA: Insufficient documentation

## 2018-12-21 LAB — CBC
HCT: 44 % (ref 36.0–46.0)
Hemoglobin: 15.3 g/dL — ABNORMAL HIGH (ref 12.0–15.0)
MCH: 32.1 pg (ref 26.0–34.0)
MCHC: 34.8 g/dL (ref 30.0–36.0)
MCV: 92.2 fL (ref 80.0–100.0)
Platelets: 269 10*3/uL (ref 150–400)
RBC: 4.77 MIL/uL (ref 3.87–5.11)
RDW: 12.3 % (ref 11.5–15.5)
WBC: 7 10*3/uL (ref 4.0–10.5)
nRBC: 0 % (ref 0.0–0.2)

## 2018-12-21 LAB — COMPREHENSIVE METABOLIC PANEL
ALT: 36 U/L (ref 0–44)
AST: 35 U/L (ref 15–41)
Albumin: 4.8 g/dL (ref 3.5–5.0)
Alkaline Phosphatase: 100 U/L (ref 38–126)
Anion gap: 10 (ref 5–15)
BUN: 21 mg/dL — ABNORMAL HIGH (ref 6–20)
CO2: 28 mmol/L (ref 22–32)
Calcium: 10.2 mg/dL (ref 8.9–10.3)
Chloride: 101 mmol/L (ref 98–111)
Creatinine, Ser: 0.89 mg/dL (ref 0.44–1.00)
GFR calc Af Amer: >60 mL/min (ref >60)
GFR calc non Af Amer: >60 mL/min (ref >60)
Glucose, Bld: 122 mg/dL — ABNORMAL HIGH (ref 70–99)
Potassium: 4.4 mmol/L (ref 3.5–5.1)
Sodium: 139 mmol/L (ref 135–145)
Total Bilirubin: 0.6 mg/dL (ref 0.3–1.2)
Total Protein: 8.6 g/dL — ABNORMAL HIGH (ref 6.5–8.1)

## 2018-12-21 LAB — URINALYSIS, COMPLETE (UACMP) WITH MICROSCOPIC
Bacteria, UA: NONE SEEN
Bilirubin Urine: NEGATIVE
Glucose, UA: NEGATIVE mg/dL
Ketones, ur: NEGATIVE mg/dL
Nitrite: POSITIVE — AB
Protein, ur: NEGATIVE mg/dL
RBC / HPF: >50 RBC/hpf — ABNORMAL HIGH (ref 0–5)
Specific Gravity, Urine: 1.017 (ref 1.005–1.030)
pH: 6 (ref 5.0–8.0)

## 2018-12-21 MED ORDER — TAMSULOSIN HCL 0.4 MG PO CAPS: 0.4000 mg | ORAL_CAPSULE | Freq: Every day | ORAL | 0 refills | Status: DC

## 2018-12-21 MED ORDER — PROMETHAZINE HCL 25 MG/ML IJ SOLN
12.5000 mg | Freq: Once | INTRAMUSCULAR | Status: AC
  Administered 2018-12-21: 12.5 mg via INTRAVENOUS
  Filled 2018-12-21: qty 1

## 2018-12-21 MED ORDER — SULFAMETHOXAZOLE-TRIMETHOPRIM 800-160 MG PO TABS
1.0000 | ORAL_TABLET | Freq: Once | ORAL | Status: AC
  Administered 2018-12-21: 1 via ORAL
  Filled 2018-12-21: qty 1

## 2018-12-21 MED ORDER — DIPHENHYDRAMINE HCL 50 MG/ML IJ SOLN
25.0000 mg | Freq: Once | INTRAMUSCULAR | Status: AC
  Administered 2018-12-21: 25 mg via INTRAVENOUS
  Filled 2018-12-21: qty 1

## 2018-12-21 MED ORDER — MORPHINE SULFATE (PF) 4 MG/ML IV SOLN
4.0000 mg | Freq: Once | INTRAVENOUS | Status: AC
  Administered 2018-12-21: 10:00:00 4 mg via INTRAVENOUS
  Filled 2018-12-21: qty 1

## 2018-12-21 MED ORDER — PROMETHAZINE HCL 25 MG PO TABS: 12.5000 mg | ORAL_TABLET | Freq: Four times a day (QID) | ORAL | 0 refills | Status: DC | PRN

## 2018-12-21 MED ORDER — OXYCODONE-ACETAMINOPHEN 5-325 MG PO TABS: 1.0000 | ORAL_TABLET | ORAL | 0 refills | Status: DC | PRN

## 2018-12-21 MED ORDER — SULFAMETHOXAZOLE-TRIMETHOPRIM 800-160 MG PO TABS: 1.0000 | ORAL_TABLET | Freq: Two times a day (BID) | ORAL | 0 refills | Status: DC

## 2018-12-21 NOTE — ED Provider Notes (Signed)
[facility] Emergency Department Provider Note  Time seen: 8:55 AM  I have reviewed the triage vital signs and the nursing notes.   HISTORY  Chief Complaint Flank Pain   HPI Tina Brennan is a 43 y.o. female with a past medical history of hypertension, IBS, presents to the emergency department for right flank pain nausea vomiting and diarrhea.  According to the patient for the past 2 days she has been experiencing pain in her right flank and lower back.  Patient states it feels like kidney stones she has had 3 prior kidney stones 1 of which required intervention.  Patient states she has been experiencing some mild dysuria as well, is taking Azo for her discomfort.  Describes her abdominal pain as mild to moderate sharp pain, does not wish for any pain medication.   Past Medical History:  Diagnosis Date  . Hypertension   . IBS (irritable bowel syndrome)   . Kidney stones   . Pancreatitis     Patient Active Problem List   Diagnosis Date Noted  . Hypertension 10/07/2018  . Migraine with status migrainosus 10/07/2018  . IBS (irritable bowel syndrome) 09/23/2018  . Headache 09/23/2018  . Blurred vision, bilateral 09/23/2018  . Healthcare maintenance 09/23/2018  . BMI 33.0-33.9,adult 09/23/2018  . Abdominal pain 02/16/2013  . Diarrhea 02/16/2013  . Nausea and vomiting 02/16/2013    Past Surgical History:  Procedure Laterality Date  . ABDOMINAL HYSTERECTOMY    . BLADDER SURGERY    . cyst removal of ovaries      Prior to Admission medications   Medication Sig Start Date End Date Taking? Authorizing Provider  aspirin-acetaminophen-caffeine (EXCEDRIN MIGRAINE) [id number] MG tablet Take 2 tablets by mouth every 6 (six) hours as needed for headache or migraine. 09/30/18   Tukov-Yual, Alroy BailiffMagdalene S, NP  cloNIDine (CATAPRES) 0.1 MG tablet Take 2 tablets (0.2 mg total) by mouth 3 (three) times daily. 10/07/18 10/07/19  Michiel CowboyMcGowan, Shannon A, PA-C  dicyclomine (BENTYL)  20 MG tablet Take 1 tablet (20 mg total) by mouth 4 (four) times daily -  before meals and at bedtime. 11/04/18   McGowan, Carollee HerterShannon A, PA-C  ibuprofen (ADVIL,MOTRIN) 800 MG tablet Take 800 mg by mouth 2 (two) times daily.    [provider]  metFORMIN (GLUCOPHAGE) 500 MG tablet Take 1 tablet (500 mg total) by mouth 2 (two) times daily with a meal. 12/07/18   Tukov-Yual, Magdalene S, NP  metoprolol succinate (TOPROL XL) 100 MG 24 hr tablet Take 1 tablet (100 mg total) by mouth daily. Take with or immediately following a meal. 11/04/18   McGowan, Carollee HerterShannon A, PA-C  metoprolol tartrate (LOPRESSOR) 50 MG tablet Take 1 tablet (50 mg total) by mouth 2 (two) times daily. 09/30/18   Tukov-Yual, Alroy BailiffMagdalene S, NP  omeprazole (PRILOSEC) 20 MG capsule Take 1 capsule (20 mg total) by mouth daily. Patient not taking: Reported on 11/04/2018 09/30/18   Andreas Ohmukov-Yual, Magdalene S, NP    Allergies  Allergen Reactions  . Penicillins Anaphylaxis  . Compazine [Prochlorperazine Edisylate] Other (See Comments)    Dystonia   . Demerol [Meperidine] Hives and Other (See Comments)    Dystonia and Fever Blisters.   . Reglan [Metoclopramide] Other (See Comments)    Dystonia   . Toradol [Ketorolac Tromethamine] Other (See Comments)    Dystonia   . Zofran [Ondansetron Hcl]     Dystonia     No family history on file.  Social History Social History   Tobacco Use  .  Smoking status: Never Smoker  . Smokeless tobacco: Never Used  Substance Use Topics  . Alcohol use: No  . Drug use: Yes    Types: Marijuana    Comment: occ    Review of Systems Constitutional: Negative for fever. Cardiovascular: Negative for chest pain. Respiratory: Negative for shortness of breath. Gastrointestinal: Right flank pain.  Positive for nausea vomiting intermittent diarrhea Genitourinary: Mild dysuria.  Urine is discolored due to Azo. Musculoskeletal: Negative for musculoskeletal complaints Skin: Negative for skin complaints   Neurological: Negative for headache All other ROS negative  ____________________________________________   PHYSICAL EXAM:  VITAL SIGNS: ED Triage Vitals  Enc Vitals Group     BP 12/21/18 0813 (!) 169/114     Pulse Rate 12/21/18 0813 84     Resp 12/21/18 0813 16     Temp 12/21/18 0813 98.1 F (36.7 C)     Temp Source 12/21/18 0813 Oral     SpO2 12/21/18 0813 96 %     Weight 12/21/18 0816 190 lb (86.2 kg)     Height 12/21/18 0816 5\' 4"  (1.626 m)     Head Circumference --      Peak Flow --      Pain Score 12/21/18 0816 7     Pain Loc --      Pain Edu? --      Excl. in GC? --    Constitutional: Alert and oriented. Well appearing and in no distress. Eyes: Normal exam ENT      Head: Normocephalic and atraumatic.      Mouth/Throat: Mucous membranes are moist. Cardiovascular: Normal rate, regular rhythm. No murmur Respiratory: Normal respiratory effort without tachypnea nor retractions. Breath sounds are clear  Gastrointestinal: Soft and nontender. No distention.  No CVA tenderness palpation. Musculoskeletal: Nontender with normal range of motion in all extremities.  Neurologic:  Normal speech and language. No gross focal neurologic deficits  Skin:  Skin is warm, dry and intact.  Psychiatric: Mood and affect are normal.     RADIOLOGY  3 mm right UVJ stone.  ____________________________________________   INITIAL IMPRESSION / ASSESSMENT AND PLAN / ED COURSE  Pertinent labs & imaging results that were available during my care of the patient were reviewed by me and considered in my medical decision making (see chart for details).   Patient presents emergency department for right flank pain, states it feels like a kidney stone.  Differential would include ureterolithiasis, kidney stone, renal colic, UTI or pyelonephritis, appendicitis or diverticulitis.  Patient states she is a very hard stick and would prefer to avoid blood work/IV if possible.  Overall the patient appears  well with reassuring vitals besides hypertension which could very likely be pain related.  We will proceed with urinalysis and a CT renal scan.  We will reassess if blood work is needed after the CT per patient's wishes.  Patient's urinalysis is nitrite positive, 3 mm right UVJ stone.  We will proceed with blood work to check a white blood cell count.    Patient's lab work largely nonrevealing, normal white blood cell count.  Patient's pain is better after medication.  We will discharge with Flomax, cover with Keflex a urine culture has been sent.  I discussed extremely strict return precautions for any worsening pain or development of fever.  Patient agreeable to plan of care.  We will follow-up with urology.  Tina Brennan was evaluated in Emergency Department on 12/21/2018 for the symptoms described in the history of present illness.  She was evaluated in the context of the global COVID-19 pandemic, which necessitated consideration that the patient might be at risk for infection with the SARS-CoV-2 virus that causes COVID-19. Institutional protocols and algorithms that pertain to the evaluation of patients at risk for COVID-19 are in a state of rapid change based on information released by regulatory bodies including the CDC and federal and state organizations. These policies and algorithms were followed during the patient's care in the ED.  ____________________________________________   FINAL CLINICAL IMPRESSION(S) / ED DIAGNOSES  Right flank pain Kidney stone   Harvest Dark, MD 12/21/18 1339

## 2018-12-21 NOTE — ED Notes (Signed)
EDP at bedside  

## 2018-12-21 NOTE — ED Triage Notes (Signed)
Patient presents to ED via POV from home with c/o right flank pain x 3 days. Patient states "I think I am passing a kidney stone". Patient denies difficulty urinating. Ambulatory.

## 2018-12-21 NOTE — ED Notes (Signed)
Patient pacing room upon RNs arrival. Patient declines wanting pain medication at this time

## 2018-12-21 NOTE — ED Notes (Signed)
Patient transported to CT. Patient refused wheelchair

## 2018-12-28 ENCOUNTER — Other Ambulatory Visit: Payer: Self-pay | Admitting: Urology

## 2019-01-09 ENCOUNTER — Other Ambulatory Visit: Payer: Self-pay | Admitting: Urology

## 2019-01-13 ENCOUNTER — Telehealth: Payer: Self-pay

## 2019-01-13 NOTE — Telephone Encounter (Signed)
Patient called on 7/7 requesting Rx be sent to new pharmacy. She stated she makes too much money to receive medications at [facility].  Patient then did not show for appts on 7/9 and 7/16.  Patient called on 8/13 requesting refill of medications be sent to [facility]. Based on refills previously sent to pharmacy, she should not need refills until 03/05/2019.  LVM for patient at 9:03am on 01/13/2019 that she will need to provide the last 3 most recent pay stubs for herself and anyone else in her household before continuing to refill medications.

## 2019-02-01 ENCOUNTER — Encounter (INDEPENDENT_AMBULATORY_CARE_PROVIDER_SITE_OTHER): Payer: Self-pay

## 2019-02-01 ENCOUNTER — Other Ambulatory Visit: Payer: Self-pay

## 2019-02-01 ENCOUNTER — Ambulatory Visit: Payer: Self-pay | Admitting: Pharmacy Technician

## 2019-02-01 DIAGNOSIS — Z79899 Other long term (current) drug therapy: Secondary | ICD-10-CM

## 2019-02-01 NOTE — Progress Notes (Signed)
Met with patient completed financial assistance application for [facility] due to recent hospital visit.  Patient agreed to be responsible for gathering financial information and forwarding to appropriate department in .    Completed [facility] application and contract.  Patient agreed to all terms of the [facility] contract.    Patient approved to receive medication assistance at [facility] as long as eligibility criteria continues to be met.    Provided patient with community resource material based on her particular needs.    Tina Brennan Care Manager [facility]  

## 2019-02-09 ENCOUNTER — Other Ambulatory Visit: Payer: Self-pay

## 2019-02-09 MED ORDER — DICYCLOMINE HCL 20 MG PO TABS: 20.0000 mg | ORAL_TABLET | Freq: Three times a day (TID) | ORAL | 0 refills | Status: DC

## 2019-02-09 MED ORDER — METFORMIN HCL 500 MG PO TABS: 500.0000 mg | ORAL_TABLET | Freq: Two times a day (BID) | ORAL | 0 refills | Status: DC

## 2019-02-26 ENCOUNTER — Encounter: Payer: Self-pay | Admitting: Emergency Medicine

## 2019-02-26 ENCOUNTER — Emergency Department: Payer: Self-pay

## 2019-02-26 ENCOUNTER — Emergency Department
Admission: EM | Admit: 2019-02-26 | Discharge: 2019-02-26 | Disposition: A | Discharge status: Home / Self Care | Payer: Self-pay | Attending: Emergency Medicine | Admitting: Emergency Medicine

## 2019-02-26 ENCOUNTER — Other Ambulatory Visit: Payer: Self-pay

## 2019-02-26 DIAGNOSIS — Z7984 Long term (current) use of oral hypoglycemic drugs: Secondary | ICD-10-CM | POA: Insufficient documentation

## 2019-02-26 DIAGNOSIS — N23 Unspecified renal colic: Secondary | ICD-10-CM | POA: Insufficient documentation

## 2019-02-26 DIAGNOSIS — I10 Essential (primary) hypertension: Secondary | ICD-10-CM | POA: Insufficient documentation

## 2019-02-26 DIAGNOSIS — Z79899 Other long term (current) drug therapy: Secondary | ICD-10-CM | POA: Insufficient documentation

## 2019-02-26 LAB — BASIC METABOLIC PANEL
Anion gap: 10 (ref 5–15)
BUN: 15 mg/dL (ref 6–20)
CO2: 24 mmol/L (ref 22–32)
Calcium: 9.5 mg/dL (ref 8.9–10.3)
Chloride: 103 mmol/L (ref 98–111)
Creatinine, Ser: 0.7 mg/dL (ref 0.44–1.00)
GFR calc Af Amer: >60 mL/min (ref >60)
GFR calc non Af Amer: >60 mL/min (ref >60)
Glucose, Bld: 110 mg/dL — ABNORMAL HIGH (ref 70–99)
Potassium: 4 mmol/L (ref 3.5–5.1)
Sodium: 137 mmol/L (ref 135–145)

## 2019-02-26 LAB — CBC WITH DIFFERENTIAL/PLATELET
Abs Immature Granulocytes: 0.02 10*3/uL (ref 0.00–0.07)
Basophils Absolute: 0 10*3/uL (ref 0.0–0.1)
Basophils Relative: 1 %
Eosinophils Absolute: 0.2 10*3/uL (ref 0.0–0.5)
Eosinophils Relative: 3 %
HCT: 40.6 % (ref 36.0–46.0)
Hemoglobin: 14.5 g/dL (ref 12.0–15.0)
Immature Granulocytes: 0 %
Lymphocytes Relative: 36 %
Lymphs Abs: 2.6 10*3/uL (ref 0.7–4.0)
MCH: 31.7 pg (ref 26.0–34.0)
MCHC: 35.7 g/dL (ref 30.0–36.0)
MCV: 88.8 fL (ref 80.0–100.0)
Monocytes Absolute: 0.4 10*3/uL (ref 0.1–1.0)
Monocytes Relative: 5 %
Neutro Abs: 4 10*3/uL (ref 1.7–7.7)
Neutrophils Relative %: 55 %
Platelets: 243 10*3/uL (ref 150–400)
RBC: 4.57 MIL/uL (ref 3.87–5.11)
RDW: 12 % (ref 11.5–15.5)
WBC: 7.2 10*3/uL (ref 4.0–10.5)
nRBC: 0 % (ref 0.0–0.2)

## 2019-02-26 LAB — URINALYSIS, COMPLETE (UACMP) WITH MICROSCOPIC
Bacteria, UA: NONE SEEN
Bilirubin Urine: NEGATIVE
Glucose, UA: NEGATIVE mg/dL
Ketones, ur: NEGATIVE mg/dL
Nitrite: NEGATIVE
Protein, ur: 30 mg/dL — AB
Specific Gravity, Urine: 1.02 (ref 1.005–1.030)
pH: 5 (ref 5.0–8.0)

## 2019-02-26 LAB — PREGNANCY, URINE: Preg Test, Ur: NEGATIVE

## 2019-02-26 MED ORDER — HYDROMORPHONE HCL 1 MG/ML IJ SOLN
0.5000 mg | Freq: Once | INTRAMUSCULAR | Status: AC
  Administered 2019-02-26: 0.5 mg via INTRAVENOUS
  Filled 2019-02-26: qty 1

## 2019-02-26 MED ORDER — PROMETHAZINE HCL 25 MG/ML IJ SOLN
12.5000 mg | Freq: Once | INTRAMUSCULAR | Status: AC
  Administered 2019-02-26: 12.5 mg via INTRAVENOUS
  Filled 2019-02-26: qty 1

## 2019-02-26 MED ORDER — OXYCODONE-ACETAMINOPHEN 5-325 MG PO TABS: 1.0000 | ORAL_TABLET | ORAL | 0 refills | Status: DC | PRN

## 2019-02-26 NOTE — Discharge Instructions (Addendum)
Please use the Percocet 1 pill 4 times a day as needed for pain.  Please follow-up with urology.  You can follow-up with Dr. Erlene Quan .  Please return for worse pain, fever or vomiting.

## 2019-02-26 NOTE — ED Triage Notes (Signed)
L flank pain x 4 days.

## 2019-02-26 NOTE — ED Notes (Signed)
Pt verbalized understanding of discharge instructions. NAD at this time. 

## 2019-02-26 NOTE — ED Provider Notes (Signed)
[facility] Emergency Department Provider Note  ____________________________________________   First MD Initiated Contact with Patient 02/26/19 1225     (approximate)  I have reviewed the triage vital signs and the nursing notes.   HISTORY  Chief Complaint Flank Pain   HPI Tina Brennan is a 43 y.o. female with a history of frequent kidney stones who reports she has been having left flank pain which is been getting worse over the last couple days.  This feels like her usual kidney stone pain.  It is radiating from the left CVA area down toward the groin on the left side.  Pain is intermittently severe.  She has some nausea with it.  Nothing makes it better or worse.      Past Medical History:  Diagnosis Date  . Hypertension   . IBS (irritable bowel syndrome)   . Kidney stones   . Pancreatitis     Patient Active Problem List   Diagnosis Date Noted  . Hypertension 10/07/2018  . Migraine with status migrainosus 10/07/2018  . IBS (irritable bowel syndrome) 09/23/2018  . Headache 09/23/2018  . Blurred vision, bilateral 09/23/2018  . Healthcare maintenance 09/23/2018  . BMI 33.0-33.9,adult 09/23/2018  . Abdominal pain 02/16/2013  . Diarrhea 02/16/2013  . Nausea and vomiting 02/16/2013    Past Surgical History:  Procedure Laterality Date  . ABDOMINAL HYSTERECTOMY    . BLADDER SURGERY    . cyst removal of ovaries      Prior to Admission medications   Medication Sig Start Date End Date Taking? Authorizing Provider  Cinnamon 500 MG TABS Take 500 mg by mouth daily.    [provider]  cloNIDine (CATAPRES) 0.1 MG tablet Take 2 tablets (0.2 mg total) by mouth 3 (three) times daily. Patient taking differently: Take 0.2 mg by mouth daily as needed.  10/07/18 10/07/19  Zara Council A, PA-C  dicyclomine (BENTYL) 20 MG tablet Take 1 tablet (20 mg total) by mouth 4 (four) times daily -  before meals and at bedtime. 02/09/19   Tukov-Yual,  Arlyss Gandy, NP  ibuprofen (ADVIL) 200 MG tablet Take 200 mg by mouth every 6 (six) hours as needed.    [provider]  metFORMIN (GLUCOPHAGE) 500 MG tablet Take 1 tablet (500 mg total) by mouth 2 (two) times daily with a meal. 02/09/19   Tukov-Yual, Arlyss Gandy, NP  metoprolol succinate (TOPROL-XL) 100 MG 24 hr tablet TAKE 1 TABLET BY MOUTH ONCE DAILY TAKE  WITH  OR  IMMEDIATELY  FOLLOWING  A  MEAL 01/06/19   McGowan, Larene Beach A, PA-C  omeprazole (PRILOSEC) 20 MG capsule Take 1 capsule (20 mg total) by mouth daily. 09/30/18   Tukov-Yual, Arlyss Gandy, NP  oxyCODONE-acetaminophen (PERCOCET) 5-325 MG tablet Take 1 tablet by mouth every 4 (four) hours as needed for severe pain. 12/21/18   Harvest Dark, MD  promethazine (PHENERGAN) 25 MG tablet Take 0.5 tablets (12.5 mg total) by mouth every 6 (six) hours as needed for nausea or vomiting. 12/21/18   Harvest Dark, MD  sulfamethoxazole-trimethoprim (BACTRIM DS) 800-160 MG tablet Take 1 tablet by mouth 2 (two) times daily. 12/21/18   Harvest Dark, MD  tamsulosin (FLOMAX) 0.4 MG CAPS capsule Take 1 capsule (0.4 mg total) by mouth daily. 12/21/18   Harvest Dark, MD    Allergies Penicillins, Demerol [meperidine], Reglan [metoclopramide], Toradol [ketorolac tromethamine], Zofran [ondansetron hcl], and Compazine [prochlorperazine edisylate]  No family history on file.  Social History Social History   Tobacco  Use  . Smoking status: Never Smoker  . Smokeless tobacco: Never Used  Substance Use Topics  . Alcohol use: No  . Drug use: Yes    Types: Marijuana    Comment: occ    Review of Systems  Constitutional: No fever/chills Eyes: No visual changes. ENT: No sore throat. Cardiovascular: Denies chest pain. Respiratory: Denies shortness of breath. Gastrointestinal: See HPI Genitourinary: Negative for dysuria. Musculoskeletal: Negative for back pain. Skin: Negative for rash. Neurological: Negative for headaches, focal  weakness   ____________________________________________   PHYSICAL EXAM:  VITAL SIGNS: ED Triage Vitals  Enc Vitals Group     BP 02/26/19 1220 (!) 196/107     Pulse Rate 02/26/19 1220 94     Resp 02/26/19 1220 18     Temp 02/26/19 1220 98.6 F (37 C)     Temp Source 02/26/19 1220 Oral     SpO2 02/26/19 1220 98 %     Weight 02/26/19 1222 180 lb (81.6 kg)     Height 02/26/19 1222 5\' 4"  (1.626 m)     Head Circumference --      Peak Flow --      Pain Score 02/26/19 1222 7     Pain Loc --      Pain Edu? --      Excl. in [place]? --     Constitutional: Alert and oriented.  Looks uncomfortable Eyes: Conjunctivae are normal.  Head: Atraumatic. Nose: No congestion/rhinnorhea. Mouth/Throat: Mucous membranes are moist.  Oropharynx non-erythematous. Neck: No stridor. Cardiovascular: Normal rate, regular rhythm. Grossly normal heart sounds.  Good peripheral circulation. Respiratory: Normal respiratory effort.  No retractions. Lungs CTAB. Gastrointestinal: Soft and nontender. No distention. No abdominal bruits. No CVA tenderness. Musculoskeletal: No lower extremity tenderness nor edema.   Neurologic:  Normal speech and language. No gross focal neurologic deficits are appreciated. No gait instability. Skin:  Skin is warm, dry and intact. No rash noted.   ____________________________________________   LABS (all labs ordered are listed, but only abnormal results are displayed)  Labs Reviewed  URINALYSIS, COMPLETE (UACMP) WITH MICROSCOPIC - Abnormal; Notable for the following components:      Result Value   Color, Urine YELLOW (*)    APPearance CLEAR (*)    Hgb urine dipstick SMALL (*)    Protein, ur 30 (*)    Leukocytes,Ua SMALL (*)    All other components within normal limits  BASIC METABOLIC PANEL - Abnormal; Notable for the following components:   Glucose, Bld 110 (*)    All other components within normal limits  PREGNANCY, URINE  CBC WITH DIFFERENTIAL/PLATELET    ____________________________________________  EKG   ____________________________________________  RADIOLOGY  ED MD interpretati KUB may show some renal stones.  The ultrasound does not show any hydronephrosis.  Patient's symptoms are consistent with renal colic.  Official radiology report(s): Dg Abdomen 1 View  Result Date: 02/26/2019 CLINICAL DATA:  Left back pain, nausea EXAM: ABDOMEN - 1 VIEW COMPARISON:  CT abdomen pelvis, 12/21/2018 FINDINGS: There are multiple tiny calculi projecting over the kidneys. A previously noted calculus at the right ureterovesicular junction is not noted. There are small adjacent phleboliths seen on prior CT. Nonobstructive pattern of bowel gas. IMPRESSION: There are multiple tiny calculi projecting over the kidneys. A previously noted calculus at the right ureterovesicular junction is not noted. There are small adjacent phleboliths seen on prior CT. CT is more sensitive for the detection of urinary tract calculi. Electronically Signed   By: Lauralyn PrimesAlex  Bibbey  M.D.   On: 02/26/2019 13:37   US Renal  Result Date: 02/26/2019 CLINICAL DATA:  Acute left flank pain. EXAM: RENAL / URINARY TRACT ULTRASOUND COMPLETE COMPARISON:  CT scan of December 21, 2018. FINDINGS: Right Kidney: Renal measurements: 11.6 x 4.2 x 4.0 cm = volume: 102 mL . Echogenicity within normal limits. No mass or hydronephrosis visualized. Left Kidney: Renal measurements: 10.6 x 5.1 x 5.1 cm = volume: 143 mL. Echogenicity within normal limits. No mass or hydronephrosis visualized. Bladder: Appears normal for degree of bladder distention. IMPRESSION: Normal renal ultrasound. Electronically Signed   By: Lupita Raider M.D.   On: 02/26/2019 13:17    ____________________________________________   PROCEDURES  Procedure(s) performed (including Critical Care):  Procedures   ____________________________________________   INITIAL IMPRESSION / ASSESSMENT AND PLAN / ED COURSE  Patient with KUB showing  some calcifications which may be renal stones.  No hydronephrosis on ultrasound.  We will treat her for her apparent renal colic and let her go.              ____________________________________________   FINAL CLINICAL IMPRESSION(S) / ED DIAGNOSES  Final diagnoses:  Renal colic     ED Discharge Orders    None       Note:  This document was prepared using Dragon voice recognition software and may include unintentional dictation errors.    Arnaldo Natal, MD 02/26/19 1415

## 2019-02-27 ENCOUNTER — Other Ambulatory Visit: Payer: Self-pay

## 2019-02-27 ENCOUNTER — Encounter: Payer: Self-pay | Admitting: Emergency Medicine

## 2019-02-27 DIAGNOSIS — I10 Essential (primary) hypertension: Secondary | ICD-10-CM | POA: Insufficient documentation

## 2019-02-27 DIAGNOSIS — Z7984 Long term (current) use of oral hypoglycemic drugs: Secondary | ICD-10-CM | POA: Insufficient documentation

## 2019-02-27 DIAGNOSIS — F1210 Cannabis abuse, uncomplicated: Secondary | ICD-10-CM | POA: Insufficient documentation

## 2019-02-27 DIAGNOSIS — Z79899 Other long term (current) drug therapy: Secondary | ICD-10-CM | POA: Insufficient documentation

## 2019-02-27 DIAGNOSIS — R109 Unspecified abdominal pain: Secondary | ICD-10-CM | POA: Insufficient documentation

## 2019-02-27 DIAGNOSIS — N390 Urinary tract infection, site not specified: Secondary | ICD-10-CM | POA: Insufficient documentation

## 2019-02-27 LAB — POCT PREGNANCY, URINE: Preg Test, Ur: NEGATIVE

## 2019-02-27 NOTE — ED Triage Notes (Signed)
Pt to triage ambulatory with no difficulty. Pt reports pain to her left flank for about 6 days. Seen yesterday and told kidney stone.

## 2019-02-28 ENCOUNTER — Emergency Department: Payer: Self-pay

## 2019-02-28 ENCOUNTER — Emergency Department
Admission: EM | Admit: 2019-02-28 | Discharge: 2019-02-28 | Disposition: A | Discharge status: Home / Self Care | Payer: Self-pay | Attending: Emergency Medicine | Admitting: Emergency Medicine

## 2019-02-28 DIAGNOSIS — N390 Urinary tract infection, site not specified: Secondary | ICD-10-CM

## 2019-02-28 DIAGNOSIS — R109 Unspecified abdominal pain: Secondary | ICD-10-CM

## 2019-02-28 LAB — URINALYSIS, COMPLETE (UACMP) WITH MICROSCOPIC
Bacteria, UA: NONE SEEN
Bilirubin Urine: NEGATIVE
Glucose, UA: NEGATIVE mg/dL
Hgb urine dipstick: NEGATIVE
Ketones, ur: NEGATIVE mg/dL
Nitrite: NEGATIVE
Protein, ur: NEGATIVE mg/dL
Specific Gravity, Urine: 1.008 (ref 1.005–1.030)
pH: 6 (ref 5.0–8.0)

## 2019-02-28 MED ORDER — PHENAZOPYRIDINE HCL 95 MG PO TABS: 190.0000 mg | ORAL_TABLET | Freq: Three times a day (TID) | ORAL | 0 refills | Status: DC | PRN

## 2019-02-28 MED ORDER — CEPHALEXIN 500 MG PO CAPS
500.0000 mg | ORAL_CAPSULE | Freq: Once | ORAL | Status: AC
  Administered 2019-02-28: 500 mg via ORAL
  Filled 2019-02-28: qty 1

## 2019-02-28 MED ORDER — CEPHALEXIN 500 MG PO CAPS: 500.0000 mg | ORAL_CAPSULE | Freq: Four times a day (QID) | ORAL | 0 refills | Status: AC

## 2019-02-28 MED ORDER — CEPHALEXIN 500 MG PO CAPS: 500.0000 mg | ORAL_CAPSULE | Freq: Four times a day (QID) | ORAL | 0 refills | Status: DC

## 2019-02-28 NOTE — ED Provider Notes (Signed)
[facility] Emergency Department Provider Note  ____________________________________________   First MD Initiated Contact with Patient 02/28/19 0402     (approximate)  I have reviewed the triage vital signs and the nursing notes.   HISTORY  Chief Complaint Flank Pain    HPI Tina Brennan is a 43 y.o. female with medical history as listed below which notably includes a prior history of kidney stones that have occasionally passed spontaneously and which occasionally required lithotripsy or other urological intervention.  She presents tonight for persistent symptoms  of sharp stabbing left flank pain, urinary urgency, urinary pressure, and dysuria that feels "like peeing glass".  Symptoms have been going on for about 6 days and are not getting better.  She came to the emergency department yesterday and had a renal ultrasound and some plain films that showed probable kidney stones and she was treated empirically for renal colic.  Her symptoms are no better so she came back tonight for further evaluation.  She denies fever/chills, vomiting, chest pain, cough, shortness of breath, and any other abdominal pain.  She has had some nausea but she thinks that might be associated with the Percocet she received yesterday.  Nothing in particular seems to make the symptoms better or worse although the Percocet will temporarily relieve the discomfort.  She was not treated for a urinary tract infection when evaluated yesterday.        Past Medical History:  Diagnosis Date  . Hypertension   . IBS (irritable bowel syndrome)   . Kidney stones   . Pancreatitis     Patient Active Problem List   Diagnosis Date Noted  . Hypertension 10/07/2018  . Migraine with status migrainosus 10/07/2018  . IBS (irritable bowel syndrome) 09/23/2018  . Headache 09/23/2018  . Blurred vision, bilateral 09/23/2018  . Healthcare maintenance 09/23/2018  . BMI 33.0-33.9,adult 09/23/2018  .  Abdominal pain 02/16/2013  . Diarrhea 02/16/2013  . Nausea and vomiting 02/16/2013    Past Surgical History:  Procedure Laterality Date  . ABDOMINAL HYSTERECTOMY    . BLADDER SURGERY    . cyst removal of ovaries      Prior to Admission medications   Medication Sig Start Date End Date Taking? Authorizing Provider  cephALEXin (KEFLEX) 500 MG capsule Take 1 capsule (500 mg total) by mouth 4 (four) times daily for 12 days. 02/28/19 03/12/19  Loleta Rose, MD  Cinnamon 500 MG TABS Take 500 mg by mouth daily.    [provider]  cloNIDine (CATAPRES) 0.1 MG tablet Take 2 tablets (0.2 mg total) by mouth 3 (three) times daily. Patient taking differently: Take 0.2 mg by mouth daily as needed.  10/07/18 10/07/19  Michiel Cowboy A, PA-C  dicyclomine (BENTYL) 20 MG tablet Take 1 tablet (20 mg total) by mouth 4 (four) times daily -  before meals and at bedtime. 02/09/19   Tukov-Yual, Alroy Bailiff, NP  ibuprofen (ADVIL) 200 MG tablet Take 200 mg by mouth every 6 (six) hours as needed.    [provider]  metFORMIN (GLUCOPHAGE) 500 MG tablet Take 1 tablet (500 mg total) by mouth 2 (two) times daily with a meal. 02/09/19   Tukov-Yual, Alroy Bailiff, NP  metoprolol succinate (TOPROL-XL) 100 MG 24 hr tablet TAKE 1 TABLET BY MOUTH ONCE DAILY TAKE  WITH  OR  IMMEDIATELY  FOLLOWING  A  MEAL 01/06/19   McGowan, Carollee Herter A, PA-C  omeprazole (PRILOSEC) 20 MG capsule Take 1 capsule (20 mg total) by mouth daily.  09/30/18   Tukov-Yual, Arlyss Gandy, NP  oxyCODONE-acetaminophen (PERCOCET) 5-325 MG tablet Take 1 tablet by mouth every 4 (four) hours as needed for severe pain. 12/21/18   Harvest Dark, MD  oxyCODONE-acetaminophen (PERCOCET) 5-325 MG tablet Take 1 tablet by mouth every 4 (four) hours as needed for severe pain. 02/26/19 02/26/20  Nena Polio, MD  phenazopyridine (PYRIDIUM) 95 MG tablet Take 2 tablets (190 mg total) by mouth 3 (three) times daily with meals as needed for pain (bladder pain/spasms).  02/28/19   Hinda Kehr, MD  promethazine (PHENERGAN) 25 MG tablet Take 0.5 tablets (12.5 mg total) by mouth every 6 (six) hours as needed for nausea or vomiting. 12/21/18   Harvest Dark, MD  sulfamethoxazole-trimethoprim (BACTRIM DS) 800-160 MG tablet Take 1 tablet by mouth 2 (two) times daily. 12/21/18   Harvest Dark, MD  tamsulosin (FLOMAX) 0.4 MG CAPS capsule Take 1 capsule (0.4 mg total) by mouth daily. 12/21/18   Harvest Dark, MD    Allergies Penicillins, Demerol [meperidine], Reglan [metoclopramide], Toradol [ketorolac tromethamine], Zofran [ondansetron hcl], and Compazine [prochlorperazine edisylate]  History reviewed. No pertinent family history.  Social History Social History   Tobacco Use  . Smoking status: Never Smoker  . Smokeless tobacco: Never Used  Substance Use Topics  . Alcohol use: No  . Drug use: Yes    Types: Marijuana    Comment: occ    Review of Systems Constitutional: No fever/chills Eyes: No visual changes. ENT: No sore throat. Cardiovascular: Denies chest pain. Respiratory: Denies shortness of breath. Gastrointestinal: Left flank pain as described above with nausea but no vomiting. Genitourinary: Dysuria and urinary frequency/urgency. Musculoskeletal: Negative for neck pain.  Negative for back pain. Integumentary: Negative for rash. Neurological: Negative for headaches, focal weakness or numbness.   ____________________________________________   PHYSICAL EXAM:  VITAL SIGNS: ED Triage Vitals  Enc Vitals Group     BP 02/27/19 2345 (!) 178/11     Pulse Rate 02/27/19 2345 100     Resp 02/27/19 2345 18     Temp 02/27/19 2345 97.8 F (36.6 C)     Temp Source 02/27/19 2345 Oral     SpO2 02/27/19 2345 100 %     Weight 02/27/19 2341 81.6 kg (180 lb)     Height 02/27/19 2341 1.626 m (5\' 4" )     Head Circumference --      Peak Flow --      Pain Score 02/27/19 2341 9     Pain Loc --      Pain Edu? --      Excl. in [place]? --      Constitutional: Alert and oriented.  No acute distress. Eyes: Conjunctivae are normal.  Head: Atraumatic. Nose: No congestion/rhinnorhea. Mouth/Throat: Mucous membranes are moist. Neck: No stridor.  No meningeal signs.   Cardiovascular: Normal rate, regular rhythm. Good peripheral circulation. Grossly normal heart sounds. Respiratory: Normal respiratory effort.  No retractions. Gastrointestinal: Soft and nontender. No distention.  Genitourinary: Deferred Musculoskeletal: Left CVA tenderness to percussion.  No lower extremity tenderness nor edema. No gross deformities of extremities. Neurologic:  Normal speech and language. No gross focal neurologic deficits are appreciated.  Skin:  Skin is warm, dry and intact. Psychiatric: Mood and affect are normal. Speech and behavior are normal.  ____________________________________________   LABS (all labs ordered are listed, but only abnormal results are displayed)  Labs Reviewed  URINALYSIS, COMPLETE (UACMP) WITH MICROSCOPIC - Abnormal; Notable for the following components:      Result  Value   Color, Urine STRAW (*)    APPearance CLEAR (*)    Leukocytes,Ua MODERATE (*)    All other components within normal limits  POCT PREGNANCY, URINE  POC URINE PREG, ED   ____________________________________________  EKG  No indication for EKG ____________________________________________  RADIOLOGY I, Loleta Rose, personally viewed and evaluated these images (plain radiographs) as part of my medical decision making, as well as reviewing the written report by the radiologist.  ED MD interpretation: Nephrolithiasis bilaterally but no ureteral stones  Official radiology report(s): Ct Renal Stone Study  Result Date: 02/28/2019 CLINICAL DATA:  Left flank pain. EXAM: CT ABDOMEN AND PELVIS WITHOUT CONTRAST TECHNIQUE: Multidetector CT imaging of the abdomen and pelvis was performed following the standard protocol without IV contrast. COMPARISON:   Radiograph 02/26/2019. CT 12/21/2018 FINDINGS: Lower chest: Subsegmental linear atelectasis in both lower lobes. No confluent airspace disease. No pleural fluid. Hepatobiliary: No focal hepatic abnormality. Again seen hepatic steatosis with focal fatty sparing adjacent to the gallbladder fossa. Gallbladder physiologically distended, no calcified stone. No biliary dilatation. Pancreas: No ductal dilatation or inflammation. Spleen: Normal in size without focal abnormality. 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 Adrenals/Urinary Tract: Normal adrenal glands. Bilateral nonobstructing renal calculi. No hydronephrosis. No perinephric edema. Both ureters are decompressed without stones along the course. Multiple retroperitoneal phleboliths. Urinary bladder is physiologically distended. No bladder wall thickening. No bladder stone. Stomach/Bowel: Small hiatal hernia. Stomach otherwise unremarkable. No bowel wall thickening, inflammatory change, or obstruction. Appendix not visualized, presumably surgically absent. Vascular/Lymphatic: Abdominal aorta is normal in caliber. Few prominent periportal nodes. Reproductive: Status post hysterectomy. No adnexal masses. Other: No free air, free fluid, or intra-abdominal fluid collection. Tiny fat containing umbilical hernia. Musculoskeletal: There are no acute or suspicious osseous abnormalities. IMPRESSION: 1. Bilateral nonobstructing nephrolithiasis. No hydronephrosis or obstructive uropathy. 2. No acute abnormality in the abdomen/pelvis. 3. Hepatic steatosis. 4. Stable subcentimeter splenic artery aneurysm. Electronically Signed   By: Narda Rutherford M.D.   On: 02/28/2019 03:58    ____________________________________________   PROCEDURES   Procedure(s) performed (including Critical Care):  Procedures   ____________________________________________   INITIAL IMPRESSION / MDM / ASSESSMENT AND PLAN / ED COURSE  As part of my  medical decision making, I reviewed the following data within the electronic MEDICAL RECORD NUMBER Nursing notes reviewed and incorporated, Labs reviewed , Old chart reviewed, Notes from prior ED visits and [place] Controlled Substance Database   Insert differential UTI/pyelonephritis, ureteral colic, persistent pain from recently passed ureteral stone, much less likely renal ischemia.  The patient had lab work performed yesterday which is reassuring.  No leukocytosis and no indication of infection on the urinalysis with some red blood cells.  Given that she had an ultrasound and some plain films but no CT and is here with persistent symptoms, I ordered a CT renal stone protocol tonight which showed bilateral nephrolithiasis but no obstructive uropathy and no ureteral stones.  Her urinalysis is more consistent with infection tonight.  I am going to treat empirically for pyelonephritis with Keflex 500 mg 4 times daily x12 days with the first dose here in the ED tonight.  I am also giving her prescription for Pyridium.  She is not having any systemic signs of infection and I do not think she would benefit from IV antibiotics nor inpatient treatment.  She understands and agrees and I gave my usual customary return precautions.          ____________________________________________  FINAL  CLINICAL IMPRESSION(S) / ED DIAGNOSES  Final diagnoses:  Left flank pain  Urinary tract infection without hematuria, site unspecified     MEDICATIONS GIVEN DURING THIS VISIT:  Medications  cephALEXin (KEFLEX) capsule 500 mg (has no administration in time range)     ED Discharge Orders         Ordered    cephALEXin (KEFLEX) 500 MG capsule  4 times daily    Note to Pharmacy: Though the patient has a listed anaphylactic reaction to penicillins, she has taken cephalexin in the past without any adverse reaction.   02/28/19 0433    phenazopyridine (PYRIDIUM) 95 MG tablet  3 times daily with meals PRN     02/28/19 0437           *Please note:  Florene GlenDimetra Mariotti was evaluated in Emergency Department on 02/28/2019 for the symptoms described in the history of present illness. She was evaluated in the context of the global COVID-19 pandemic, which necessitated consideration that the patient might be at risk for infection with the SARS-CoV-2 virus that causes COVID-19. Institutional protocols and algorithms that pertain to the evaluation of patients at risk for COVID-19 are in a state of rapid change based on information released by regulatory bodies including the CDC and federal and state organizations. These policies and algorithms were followed during the patient's care in the ED.  Some ED evaluations and interventions may be delayed as a result of limited staffing during the pandemic.*  Note:  This document was prepared using Dragon voice recognition software and may include unintentional dictation errors.   Loleta RoseForbach, Kamden Reber, MD 02/28/19 [phone]

## 2019-02-28 NOTE — Discharge Instructions (Addendum)
As we discussed, you have no stones in your ureters now, although it is likely you may have passed a stone recently.  We are going to treat you for urinary tract infection which could have potentially traveled up to your kidney even though there is no radiographic evidence of infection in your kidney at this time.  Please take the full course of treatment (4 times a day for the next 12 days).  Follow-up with urology as needed and return to the emergency department with new or worsening symptoms.

## 2019-04-08 ENCOUNTER — Ambulatory Visit
Admission: EM | Admit: 2019-04-08 | Discharge: 2019-04-08 | Disposition: A | Discharge status: Home / Self Care | Payer: Self-pay | Attending: Family Medicine | Admitting: Family Medicine

## 2019-04-08 ENCOUNTER — Other Ambulatory Visit: Payer: Self-pay

## 2019-04-08 ENCOUNTER — Encounter: Payer: Self-pay | Admitting: Family Medicine

## 2019-04-08 DIAGNOSIS — Z760 Encounter for issue of repeat prescription: Secondary | ICD-10-CM

## 2019-04-08 MED ORDER — DICYCLOMINE HCL 20 MG PO TABS: 20.0000 mg | ORAL_TABLET | Freq: Three times a day (TID) | ORAL | 0 refills | Status: DC

## 2019-04-08 MED ORDER — METOPROLOL SUCCINATE ER 100 MG PO TB24: ORAL_TABLET | ORAL | 0 refills | Status: AC

## 2019-04-08 MED ORDER — METFORMIN HCL 500 MG PO TABS: 500.0000 mg | ORAL_TABLET | Freq: Two times a day (BID) | ORAL | 2 refills | Status: AC

## 2019-04-08 NOTE — ED Triage Notes (Signed)
Patient in office today for medication rx 2 meds have out of both meds for 1wk

## 2019-04-08 NOTE — Discharge Instructions (Signed)
Call these 2 contacts for primary care follow up I have refilled your medications.  Continue to take your blood pressure medications and check your blood pressure at home.  Avoid high salty foods, increase water.

## 2019-04-08 NOTE — ED Provider Notes (Signed)
Tina Brennan    CSN: [id number] Arrival date & time: 04/08/19  1550      History   Chief Complaint Chief Complaint  Patient presents with  . medication refill    HPI Tina Brennan is a 43 y.o. female.   Pt is a 43 year old female with PMH of hypertension, IBS, kidney stones, pancreatitis. She presents for medication refill. She currently does not have a PCP. Pt hypertensive here today. She has not taken her BP meds. Reporting blood sugar has been under control. She has been taking cinnamon. CBG was 114 this am. Otherwise feeling okay.      Past Medical History:  Diagnosis Date  . Hypertension   . IBS (irritable bowel syndrome)   . Kidney stones   . Pancreatitis     Patient Active Problem List   Diagnosis Date Noted  . Hypertension 10/07/2018  . Migraine with status migrainosus 10/07/2018  . IBS (irritable bowel syndrome) 09/23/2018  . Headache 09/23/2018  . Blurred vision, bilateral 09/23/2018  . Healthcare maintenance 09/23/2018  . BMI 33.0-33.9,adult 09/23/2018  . Abdominal pain 02/16/2013  . Diarrhea 02/16/2013  . Nausea and vomiting 02/16/2013    Past Surgical History:  Procedure Laterality Date  . ABDOMINAL HYSTERECTOMY    . BLADDER SURGERY    . cyst removal of ovaries      OB History   No obstetric history on file.      Home Medications    Prior to Admission medications   Medication Sig Start Date End Date Taking? Authorizing Provider  Cinnamon 500 MG TABS Take 500 mg by mouth daily.   Yes [provider]  cloNIDine (CATAPRES) 0.1 MG tablet Take 2 tablets (0.2 mg total) by mouth 3 (three) times daily. Patient taking differently: Take 0.2 mg by mouth daily as needed.  10/07/18 10/07/19 Yes McGowan, Larene Beach A, PA-C  ibuprofen (ADVIL) 200 MG tablet Take 200 mg by mouth every 6 (six) hours as needed.   Yes [provider]  omeprazole (PRILOSEC) 20 MG capsule Take 1 capsule (20 mg total) by mouth daily. 09/30/18  Yes  Tukov-Yual, Arlyss Gandy, NP  dicyclomine (BENTYL) 20 MG tablet Take 1 tablet (20 mg total) by mouth 4 (four) times daily -  before meals and at bedtime. 04/08/19   Loura Halt A, NP  metFORMIN (GLUCOPHAGE) 500 MG tablet Take 1 tablet (500 mg total) by mouth 2 (two) times daily with a meal. 04/08/19   Lekita Kerekes A, NP  metoprolol succinate (TOPROL-XL) 100 MG 24 hr tablet TAKE 1 TABLET BY MOUTH ONCE DAILY TAKE  WITH  OR  IMMEDIATELY  FOLLOWING  A  MEAL 04/08/19   Leane Loring A, NP  promethazine (PHENERGAN) 25 MG tablet Take 0.5 tablets (12.5 mg total) by mouth every 6 (six) hours as needed for nausea or vomiting. 12/21/18   Harvest Dark, MD    Family History No family history on file.  Social History Social History   Tobacco Use  . Smoking status: Never Smoker  . Smokeless tobacco: Never Used  Substance Use Topics  . Alcohol use: No  . Drug use: Yes    Types: Marijuana    Comment: occ     Allergies   Penicillins, Demerol [meperidine], Reglan [metoclopramide], Toradol [ketorolac tromethamine], Zofran [ondansetron hcl], and Compazine [prochlorperazine edisylate]   Review of Systems Review of Systems  Respiratory: Negative for cough and shortness of breath.   Cardiovascular: Negative for chest pain, palpitations and leg swelling.  Gastrointestinal: Negative for abdominal pain.  Endocrine: Negative for polydipsia, polyphagia and polyuria.  Neurological: Negative for dizziness, tremors, seizures, syncope, facial asymmetry, speech difficulty, weakness, light-headedness, numbness and headaches.     Physical Exam Triage Vital Signs ED Triage Vitals  Enc Vitals Group     BP 04/08/19 1602 (!) 171/126     Pulse Rate 04/08/19 1602 87     Resp --      Temp 04/08/19 1602 98.9 F (37.2 C)     Temp src --      SpO2 04/08/19 1608 96 %     Weight 04/08/19 1559 200 lb (90.7 kg)     Height --      Head Circumference --      Peak Flow --      Pain Score 04/08/19 1559 0     Pain Loc  --      Pain Edu? --      Excl. in [place]? --    No data found.  Updated Vital Signs BP (!) 171/126 (BP Location: Left Arm)   Pulse 87   Temp 98.9 F (37.2 C)   Wt 200 lb (90.7 kg)   SpO2 96%   BMI 34.33 kg/m   Visual Acuity Right Eye Distance:   Left Eye Distance:   Bilateral Distance:    Right Eye Near:   Left Eye Near:    Bilateral Near:     Physical Exam   UC Treatments / Results  Labs (all labs ordered are listed, but only abnormal results are displayed) Labs Reviewed - No data to display  EKG   Radiology No results found.  Procedures Procedures (including critical care time)  Medications Ordered in UC Medications - No data to display  Initial Impression / Assessment and Plan / UC Course  I have reviewed the triage vital signs and the nursing notes.  Pertinent labs & imaging results that were available during my care of the patient were reviewed by me and considered in my medical decision making (see chart for details).     Medication refill- refilled metformin, metoprolol and bentyl. Gave 2 contacts for PCP follow up.  Recommended monitoring BPs and watching her diet.  Follow up as needed for continued or worsening symptoms Final Clinical Impressions(s) / UC Diagnoses   Final diagnoses:  Medication refill     Discharge Instructions     Call these 2 contacts for primary care follow up I have refilled your medications.  Continue to take your blood pressure medications and check your blood pressure at home.  Avoid high salty foods, increase water.      ED Prescriptions    Medication Sig Dispense Auth. Provider   dicyclomine (BENTYL) 20 MG tablet Take 1 tablet (20 mg total) by mouth 4 (four) times daily -  before meals and at bedtime. 120 tablet Silvio Sausedo A, NP   metFORMIN (GLUCOPHAGE) 500 MG tablet Take 1 tablet (500 mg total) by mouth 2 (two) times daily with a meal. 60 tablet Shubh Chiara A, NP   metoprolol succinate (TOPROL-XL) 100 MG 24  hr tablet TAKE 1 TABLET BY MOUTH ONCE DAILY TAKE  WITH  OR  IMMEDIATELY  FOLLOWING  A  MEAL 90 tablet Keamber Macfadden A, NP     PDMP not reviewed this encounter.   Janace Aris, NP 04/08/19 1631

## 2019-04-25 ENCOUNTER — Telehealth: Payer: Self-pay | Admitting: Emergency Medicine

## 2019-04-25 NOTE — Telephone Encounter (Signed)
Patient came into office request additional refill for meds that was done for her on last visit.  Spoke with Claiborne Billings whom advised me to inform patient we will reach back out to her after [facility] provider whom refilled med for her.  Per kelly inform patient no refill will be given need to contact a PCP.

## 2019-05-25 ENCOUNTER — Emergency Department
Admission: EM | Admit: 2019-05-25 | Discharge: 2019-05-25 | Disposition: A | Discharge status: Home / Self Care | Payer: Self-pay | Attending: Emergency Medicine | Admitting: Emergency Medicine

## 2019-05-25 ENCOUNTER — Other Ambulatory Visit: Payer: Self-pay

## 2019-05-25 ENCOUNTER — Encounter: Payer: Self-pay | Admitting: Emergency Medicine

## 2019-05-25 ENCOUNTER — Emergency Department: Payer: Self-pay

## 2019-05-25 DIAGNOSIS — Z79899 Other long term (current) drug therapy: Secondary | ICD-10-CM | POA: Insufficient documentation

## 2019-05-25 DIAGNOSIS — Z20828 Contact with and (suspected) exposure to other viral communicable diseases: Secondary | ICD-10-CM | POA: Insufficient documentation

## 2019-05-25 DIAGNOSIS — R591 Generalized enlarged lymph nodes: Secondary | ICD-10-CM | POA: Insufficient documentation

## 2019-05-25 DIAGNOSIS — I10 Essential (primary) hypertension: Secondary | ICD-10-CM | POA: Insufficient documentation

## 2019-05-25 DIAGNOSIS — F1210 Cannabis abuse, uncomplicated: Secondary | ICD-10-CM | POA: Insufficient documentation

## 2019-05-25 DIAGNOSIS — Z20822 Contact with and (suspected) exposure to covid-19: Secondary | ICD-10-CM

## 2019-05-25 LAB — CBC WITH DIFFERENTIAL/PLATELET
Abs Immature Granulocytes: 0.02 10*3/uL (ref 0.00–0.07)
Basophils Absolute: 0 10*3/uL (ref 0.0–0.1)
Basophils Relative: 1 %
Eosinophils Absolute: 0.2 10*3/uL (ref 0.0–0.5)
Eosinophils Relative: 3 %
HCT: 38 % (ref 36.0–46.0)
Hemoglobin: 13.9 g/dL (ref 12.0–15.0)
Immature Granulocytes: 0 %
Lymphocytes Relative: 25 %
Lymphs Abs: 2 10*3/uL (ref 0.7–4.0)
MCH: 32 pg (ref 26.0–34.0)
MCHC: 36.6 g/dL — ABNORMAL HIGH (ref 30.0–36.0)
MCV: 87.4 fL (ref 80.0–100.0)
Monocytes Absolute: 0.5 10*3/uL (ref 0.1–1.0)
Monocytes Relative: 6 %
Neutro Abs: 5.1 10*3/uL (ref 1.7–7.7)
Neutrophils Relative %: 65 %
Platelets: 231 10*3/uL (ref 150–400)
RBC: 4.35 MIL/uL (ref 3.87–5.11)
RDW: 12.3 % (ref 11.5–15.5)
WBC: 7.9 10*3/uL (ref 4.0–10.5)
nRBC: 0 % (ref 0.0–0.2)

## 2019-05-25 LAB — BASIC METABOLIC PANEL
Anion gap: 10 (ref 5–15)
BUN: 16 mg/dL (ref 6–20)
CO2: 25 mmol/L (ref 22–32)
Calcium: 9.3 mg/dL (ref 8.9–10.3)
Chloride: 100 mmol/L (ref 98–111)
Creatinine, Ser: 0.91 mg/dL (ref 0.44–1.00)
GFR calc Af Amer: >60 mL/min (ref >60)
GFR calc non Af Amer: >60 mL/min (ref >60)
Glucose, Bld: 154 mg/dL — ABNORMAL HIGH (ref 70–99)
Potassium: 4.1 mmol/L (ref 3.5–5.1)
Sodium: 135 mmol/L (ref 135–145)

## 2019-05-25 LAB — GROUP A STREP BY PCR: Group A Strep by PCR: NOT DETECTED

## 2019-05-25 LAB — SARS CORONAVIRUS 2 (TAT 6-24 HRS): SARS Coronavirus 2: NEGATIVE

## 2019-05-25 LAB — MONONUCLEOSIS SCREEN: Mono Screen: NEGATIVE

## 2019-05-25 MED ORDER — PREDNISONE 10 MG (21) PO TBPK: ORAL_TABLET | ORAL | 0 refills | Status: DC

## 2019-05-25 MED ORDER — AZITHROMYCIN 250 MG PO TABS: ORAL_TABLET | ORAL | 0 refills | Status: DC

## 2019-05-25 NOTE — ED Notes (Signed)
See triage note  Presents with "not feeling well" and some body aches  States she noticed that she has some swelling to her glands with slight sore throat  Afebrile on arrival

## 2019-05-25 NOTE — Discharge Instructions (Signed)
Follow-up with your regular doctor if not better in 3 days.  Return emergency department worsening.  I will call you with your monitor test results.  Your Covid test should result in 6 to 24 hours.  You will be called with this result.  If your Covid test is positive you will need to quarantine for an additional 10 days.  If negative you may return to work next day.

## 2019-05-25 NOTE — ED Notes (Signed)
Patient transported to X-ray 

## 2019-05-25 NOTE — ED Provider Notes (Signed)
[facility] Emergency Department Provider Note  ____________________________________________   First MD Initiated Contact with Patient 05/25/19 1011     (approximate)  I have reviewed the triage vital signs and the nursing notes.   HISTORY  Chief Complaint Generalized Body Aches    HPI Tina Brennan is a 43 y.o. female complains of body aches and swollen lymph nodes/glands.  She states the body aches and not feeling well started about 3 days ago.  She noticed several swollen lymph nodes in her neck, groin, and under her arms.  She denies fever, cough, vomiting or diarrhea.  She does not own a cat.    Past Medical History:  Diagnosis Date  . Hypertension   . IBS (irritable bowel syndrome)   . Kidney stones   . Pancreatitis     Patient Active Problem List   Diagnosis Date Noted  . Hypertension 10/07/2018  . Migraine with status migrainosus 10/07/2018  . IBS (irritable bowel syndrome) 09/23/2018  . Headache 09/23/2018  . Blurred vision, bilateral 09/23/2018  . Healthcare maintenance 09/23/2018  . BMI 33.0-33.9,adult 09/23/2018  . Abdominal pain 02/16/2013  . Diarrhea 02/16/2013  . Nausea and vomiting 02/16/2013    Past Surgical History:  Procedure Laterality Date  . ABDOMINAL HYSTERECTOMY    . BLADDER SURGERY    . cyst removal of ovaries      Prior to Admission medications   Medication Sig Start Date End Date Taking? Authorizing Provider  azithromycin (ZITHROMAX Z-PAK) 250 MG tablet 2 pills today then 1 pill a day for 4 days 05/25/19   Caryn Section, Linden Dolin, PA-C  Cinnamon 500 MG TABS Take 500 mg by mouth daily.    [provider]  cloNIDine (CATAPRES) 0.1 MG tablet Take 2 tablets (0.2 mg total) by mouth 3 (three) times daily. Patient taking differently: Take 0.2 mg by mouth daily as needed.  10/07/18 10/07/19  Zara Council A, PA-C  dicyclomine (BENTYL) 20 MG tablet Take 1 tablet (20 mg total) by mouth 4 (four) times daily -  before  meals and at bedtime. 04/08/19   Loura Halt A, NP  ibuprofen (ADVIL) 200 MG tablet Take 200 mg by mouth every 6 (six) hours as needed.    [provider]  metFORMIN (GLUCOPHAGE) 500 MG tablet Take 1 tablet (500 mg total) by mouth 2 (two) times daily with a meal. 04/08/19   Bast, Traci A, NP  metoprolol succinate (TOPROL-XL) 100 MG 24 hr tablet TAKE 1 TABLET BY MOUTH ONCE DAILY TAKE  WITH  OR  IMMEDIATELY  FOLLOWING  A  MEAL 04/08/19   Bast, Traci A, NP  omeprazole (PRILOSEC) 20 MG capsule Take 1 capsule (20 mg total) by mouth daily. 09/30/18   Tukov-Yual, Arlyss Gandy, NP  predniSONE (STERAPRED UNI-PAK 21 TAB) 10 MG (21) TBPK tablet Take 6 pills on day one then decrease by 1 pill each day 05/25/19   Versie Starks, PA-C  promethazine (PHENERGAN) 25 MG tablet Take 0.5 tablets (12.5 mg total) by mouth every 6 (six) hours as needed for nausea or vomiting. 12/21/18   Harvest Dark, MD    Allergies Penicillins, Demerol [meperidine], Reglan [metoclopramide], Toradol [ketorolac tromethamine], Zofran [ondansetron hcl], and Compazine [prochlorperazine edisylate]  No family history on file.  Social History Social History   Tobacco Use  . Smoking status: Never Smoker  . Smokeless tobacco: Never Used  Substance Use Topics  . Alcohol use: No  . Drug use: Yes    Types: Marijuana  Comment: occ    Review of Systems  Constitutional: No fever/chills, positive body aches Eyes: No visual changes. ENT: No sore throat. Respiratory: Denies cough Genitourinary: Negative for dysuria. Lymphadenopathy: Positive for swollen lymph nodes Musculoskeletal: Negative for back pain. Skin: Negative for rash.    ____________________________________________   PHYSICAL EXAM:  VITAL SIGNS: ED Triage Vitals  Enc Vitals Group     BP 05/25/19 1003 125/66     Pulse Rate 05/25/19 1003 69     Resp 05/25/19 1003 18     Temp 05/25/19 1003 98.5 F (36.9 C)     Temp Source 05/25/19 1003 Oral     SpO2  05/25/19 1003 95 %     Weight 05/25/19 0959 188 lb (85.3 kg)     Height 05/25/19 0959 5' 4"  (1.626 m)     Head Circumference --      Peak Flow --      Pain Score 05/25/19 0959 5     Pain Loc --      Pain Edu? --      Excl. in [place]? --     Constitutional: Alert and oriented. Well appearing and in no acute distress. Eyes: Conjunctivae are normal.  Head: Atraumatic. Nose: No congestion/rhinnorhea. Mouth/Throat: Mucous membranes are moist.  Throat is minimally red Neck:  supple positive lymphadenopathy noted anterior cervical chain and tonsillar glands are tender Cardiovascular: Normal rate, regular rhythm. Heart sounds are normal Respiratory: Normal respiratory effort.  No retractions, lungs c t a  Abd: soft nontender bs normal all 4 quad, inguinal nodes are tender GU: deferred Musculoskeletal: FROM all extremities, warm and well perfused Neurologic:  Normal speech and language.  Skin:  Skin is warm, dry and intact. No rash noted. Psychiatric: Mood and affect are normal. Speech and behavior are normal.  ____________________________________________   LABS (all labs ordered are listed, but only abnormal results are displayed)  Labs Reviewed  CBC WITH DIFFERENTIAL/PLATELET - Abnormal; Notable for the following components:      Result Value   MCHC 36.6 (*)    All other components within normal limits  BASIC METABOLIC PANEL - Abnormal; Notable for the following components:   Glucose, Bld 154 (*)    All other components within normal limits  GROUP A STREP BY PCR  SARS CORONAVIRUS 2 (TAT 6-24 HRS)  MONONUCLEOSIS SCREEN   ____________________________________________   ____________________________________________  RADIOLOGY  Chest x-ray is normal  ____________________________________________   PROCEDURES  Procedure(s) performed: No  Procedures    ____________________________________________   INITIAL IMPRESSION / ASSESSMENT AND PLAN / ED COURSE  Pertinent labs &  imaging results that were available during my care of the patient were reviewed by me and considered in my medical decision making (see chart for details).   Patient is 43 year old female presents emergency department with concerns of body aches and swollen lymph nodes.  Physical exam shows patient to be afebrile.  Vitals normal.  Tonsillar glands are tender, nodes noted along cervical chain, axillary, and inguinal.  Strep test, CBC, met B   Strep test negative, CBC is normal, metabolic panel is normal, mono test negative  Explained all the findings to the patient.  She is given prescription for Z-Pak and steroid pack.  Explained her Covid test is pending and should result in 6 to 24 hours.  She is to quarantine until [employer] test results have been called.  If negative she may return to her daily activities, positive quarantine for an additional 10 days.  Dayra  Friesen was evaluated in Emergency Department on 05/25/2019 for the symptoms described in the history of present illness. She was evaluated in the context of the global COVID-19 pandemic, which necessitated consideration that the patient might be at risk for infection with the SARS-CoV-2 virus that causes COVID-19. Institutional protocols and algorithms that pertain to the evaluation of patients at risk for COVID-19 are in a state of rapid change based on information released by regulatory bodies including the CDC and federal and state organizations. These policies and algorithms were followed during the patient's care in the ED.   As part of my medical decision making, I reviewed the following data within the [facility] notes reviewed and incorporated, Labs reviewed see above, Old chart reviewed, Radiograph reviewed chest x-ray is normal, Notes from prior ED visits and [place] Controlled Substance Database  ____________________________________________   FINAL CLINICAL IMPRESSION(S) / ED DIAGNOSES  Final diagnoses:    Lymphadenopathy  Suspected COVID-19 virus infection      NEW MEDICATIONS STARTED DURING THIS VISIT:  Discharge Medication List as of 05/25/2019 12:44 PM    START taking these medications   Details  azithromycin (ZITHROMAX Z-PAK) 250 MG tablet 2 pills today then 1 pill a day for 4 days, Print    predniSONE (STERAPRED UNI-PAK 21 TAB) 10 MG (21) TBPK tablet Take 6 pills on day one then decrease by 1 pill each day, Print         Note:  This document was prepared using Dragon voice recognition software and may include unintentional dictation errors.    Versie Starks, PA-C 05/25/19 1406    Earleen Newport, MD 05/25/19 [phone]

## 2019-05-25 NOTE — ED Triage Notes (Signed)
Pt c/o generalized body aches and swelling to her lymph nodes in her neck and groin x several days. Pt denies fevers, also c/o "just feeling really off". Pt ambulatory without difficulty around the lobby A&O x4.

## 2019-08-13 ENCOUNTER — Emergency Department
Admission: EM | Admit: 2019-08-13 | Discharge: 2019-08-13 | Disposition: A | Discharge status: Home / Self Care | Payer: Self-pay | Attending: Emergency Medicine | Admitting: Emergency Medicine

## 2019-08-13 ENCOUNTER — Other Ambulatory Visit: Payer: Self-pay

## 2019-08-13 DIAGNOSIS — I10 Essential (primary) hypertension: Secondary | ICD-10-CM | POA: Insufficient documentation

## 2019-08-13 DIAGNOSIS — E119 Type 2 diabetes mellitus without complications: Secondary | ICD-10-CM | POA: Insufficient documentation

## 2019-08-13 DIAGNOSIS — M5416 Radiculopathy, lumbar region: Secondary | ICD-10-CM | POA: Insufficient documentation

## 2019-08-13 DIAGNOSIS — Z79899 Other long term (current) drug therapy: Secondary | ICD-10-CM | POA: Insufficient documentation

## 2019-08-13 DIAGNOSIS — Z7984 Long term (current) use of oral hypoglycemic drugs: Secondary | ICD-10-CM | POA: Insufficient documentation

## 2019-08-13 HISTORY — DX: Type 2 diabetes mellitus without complications: E11.9

## 2019-08-13 MED ORDER — PREDNISONE 20 MG PO TABS
60.0000 mg | ORAL_TABLET | Freq: Once | ORAL | Status: AC
  Administered 2019-08-13: 60 mg via ORAL
  Filled 2019-08-13: qty 3

## 2019-08-13 MED ORDER — METHOCARBAMOL 500 MG PO TABS: 500.0000 mg | ORAL_TABLET | Freq: Three times a day (TID) | ORAL | 0 refills | Status: AC | PRN

## 2019-08-13 MED ORDER — PREDNISONE 10 MG (21) PO TBPK: ORAL_TABLET | ORAL | 0 refills | Status: DC

## 2019-08-13 MED ORDER — KETOROLAC TROMETHAMINE 30 MG/ML IJ SOLN
30.0000 mg | Freq: Once | INTRAMUSCULAR | Status: DC
  Filled 2019-08-13: qty 1

## 2019-08-13 MED ORDER — HYDROCODONE-ACETAMINOPHEN 5-325 MG PO TABS
1.0000 | ORAL_TABLET | Freq: Once | ORAL | Status: DC
  Filled 2019-08-13: qty 1

## 2019-08-13 NOTE — ED Notes (Signed)
Pt c/o rt leg pain x 2 months. Pt states she noticed pain at first sitting crossed leg. For the past 2 weeks pt has not been able to lay on the leg or lock out the leg w/o experiencing intense pain. Pt denies injury/fall.

## 2019-08-13 NOTE — ED Triage Notes (Signed)
Pt comes from home stating she has experienced right leg pain from hip to knee for the last "couple of months." Pt reports it started out as pain only when she would sit cross legged. Pt states she now experiences pain when she still crosses her legs, locks her legs straight and lays on it at night. PT denies any limitations with ADLs due to pain. PT walks without difficulty, mask in place, no distress noted.

## 2019-08-13 NOTE — ED Provider Notes (Signed)
Emergency Department Provider Note  ____________________________________________  Time seen: Approximately 11:08 PM  I have reviewed the triage vital signs and the nursing notes.   HISTORY  Chief Complaint Leg Pain   Historian Patient     HPI Tina Brennan is a 44 y.o. female presents to the emergency department with a burning and tingling sensation along right anterior thigh that is occurred intermittently over the past 2 months.  Patient states that symptoms are exacerbated when she sits crosslegged had.  She denies bowel or bladder incontinence or saddle anesthesia.  She denies a history of chronic low back pain.  Patient has tried ibuprofen and Aleve which has not improved her pain.  No other alleviating measures have been attempted.   Past Medical History:  Diagnosis Date  . Diabetes mellitus without complication (HCC)   . Hypertension   . IBS (irritable bowel syndrome)   . Kidney stones   . Pancreatitis      Immunizations up to date:  Yes.     Past Medical History:  Diagnosis Date  . Diabetes mellitus without complication (HCC)   . Hypertension   . IBS (irritable bowel syndrome)   . Kidney stones   . Pancreatitis     Patient Active Problem List   Diagnosis Date Noted  . Hypertension 10/07/2018  . Migraine with status migrainosus 10/07/2018  . IBS (irritable bowel syndrome) 09/23/2018  . Headache 09/23/2018  . Blurred vision, bilateral 09/23/2018  . Healthcare maintenance 09/23/2018  . BMI 33.0-33.9,adult 09/23/2018  . Abdominal pain 02/16/2013  . Diarrhea 02/16/2013  . Nausea and vomiting 02/16/2013    Past Surgical History:  Procedure Laterality Date  . ABDOMINAL HYSTERECTOMY    . BLADDER SURGERY    . cyst removal of ovaries      Prior to Admission medications   Medication Sig Start Date End Date Taking? Authorizing Provider  azithromycin (ZITHROMAX Z-PAK) 250 MG tablet 2 pills today then 1 pill a day for 4 days 05/25/19   Sherrie Mustache, Roselyn Bering, PA-C  Cinnamon 500 MG TABS Take 500 mg by mouth daily.    [provider]  cloNIDine (CATAPRES) 0.1 MG tablet Take 2 tablets (0.2 mg total) by mouth 3 (three) times daily. Patient taking differently: Take 0.2 mg by mouth daily as needed.  10/07/18 10/07/19  Michiel Cowboy A, PA-C  dicyclomine (BENTYL) 20 MG tablet Take 1 tablet (20 mg total) by mouth 4 (four) times daily -  before meals and at bedtime. 04/08/19   Dahlia Byes A, NP  ibuprofen (ADVIL) 200 MG tablet Take 200 mg by mouth every 6 (six) hours as needed.    [provider]  metFORMIN (GLUCOPHAGE) 500 MG tablet Take 1 tablet (500 mg total) by mouth 2 (two) times daily with a meal. 04/08/19   Bast, Traci A, NP  methocarbamol (ROBAXIN) 500 MG tablet Take 1 tablet (500 mg total) by mouth every 8 (eight) hours as needed for up to 5 days. 08/13/19 08/18/19  Orvil Feil, PA-C  metoprolol succinate (TOPROL-XL) 100 MG 24 hr tablet TAKE 1 TABLET BY MOUTH ONCE DAILY TAKE  WITH  OR  IMMEDIATELY  FOLLOWING  A  MEAL 04/08/19   Bast, Traci A, NP  omeprazole (PRILOSEC) 20 MG capsule Take 1 capsule (20 mg total) by mouth daily. 09/30/18   Tukov-Yual, Alroy Bailiff, NP  predniSONE (STERAPRED UNI-PAK 21 TAB) 10 MG (21) TBPK tablet Take 6 tablets the first day, take 5 tablets the second day, take 4  tablets the third day, take 3 tablets the fourth day, take 2 tablets the fifth day, take 1 tablet the sixth day. 08/13/19   Orvil Feil, PA-C  promethazine (PHENERGAN) 25 MG tablet Take 0.5 tablets (12.5 mg total) by mouth every 6 (six) hours as needed for nausea or vomiting. 12/21/18   Minna Antis, MD    Allergies Penicillins, Demerol [meperidine], Reglan [metoclopramide], Toradol [ketorolac tromethamine], Zofran [ondansetron hcl], and Compazine [prochlorperazine edisylate]  History reviewed. No pertinent family history.  Social History Social History   Tobacco Use  . Smoking status: Never Smoker  . Smokeless tobacco: Never Used   Substance Use Topics  . Alcohol use: No  . Drug use: Not Currently    Types: Marijuana    Comment: occ     Review of Systems  Constitutional: No fever/chills Eyes:  No discharge ENT: No upper respiratory complaints. Respiratory: no cough. No SOB/ use of accessory muscles to breath Gastrointestinal:   No nausea, no vomiting.  No diarrhea.  No constipation. Musculoskeletal: Patient has right anterior thigh pain. Skin: Negative for rash, abrasions, lacerations, ecchymosis.    ____________________________________________   PHYSICAL EXAM:  VITAL SIGNS: ED Triage Vitals  Enc Vitals Group     BP 08/13/19 2031 (!) 148/100     Pulse Rate 08/13/19 2031 (!) 108     Resp 08/13/19 2031 18     Temp 08/13/19 2031 99.1 F (37.3 C)     Temp Source 08/13/19 2031 Oral     SpO2 08/13/19 2031 96 %     Weight 08/13/19 2032 198 lb (89.8 kg)     Height 08/13/19 2032 5\' 4"  (1.626 m)     Head Circumference --      Peak Flow --      Pain Score 08/13/19 2031 2     Pain Loc --      Pain Edu? --      Excl. in [place]? --      Constitutional: Alert and oriented. Well appearing and in no acute distress. Eyes: Conjunctivae are normal. PERRL. EOMI. Head: Atraumatic. Cardiovascular: Normal rate, regular rhythm. Normal S1 and S2.  Good peripheral circulation. Respiratory: Normal respiratory effort without tachypnea or retractions. Lungs CTAB. Good air entry to the bases with no decreased or absent breath sounds Gastrointestinal: Bowel sounds x 4 quadrants. Soft and nontender to palpation. No guarding or rigidity. No distention. Musculoskeletal: Full range of motion to all extremities. No obvious deformities noted.  Patient has a positive straight leg raise test on the right. Neurologic:  Normal for age. No gross focal neurologic deficits are appreciated.  Skin:  Skin is warm, dry and intact. No rash noted. Psychiatric: Mood and affect are normal for age. Speech and behavior are normal.    ____________________________________________   LABS (all labs ordered are listed, but only abnormal results are displayed)  Labs Reviewed - No data to display ____________________________________________  EKG   ____________________________________________  RADIOLOGY  No results found.  ____________________________________________    PROCEDURES  Procedure(s) performed:     Procedures     Medications  predniSONE (DELTASONE) tablet 60 mg (60 mg Oral Given 08/13/19 2229)     ____________________________________________   INITIAL IMPRESSION / ASSESSMENT AND PLAN / ED COURSE  Pertinent labs & imaging results that were available during my care of the patient were reviewed by me and considered in my medical decision making (see chart for details).      Assessment and plan Lumbar radiculopathy 44 year old female  presents to the emergency department with a burning and stinging sensation along right anterior thigh.  Patient was hypertensive and mildly tachycardic at triage but vital signs were otherwise reassuring.  Patient symptoms are exacerbated by a straight leg raise test.  Patient was given her first dose of prednisone in the emergency department.  She was discharged with a prednisone taper.  She was advised to follow-up with primary care as needed.  All patient questions were answered.    ____________________________________________  FINAL CLINICAL IMPRESSION(S) / ED DIAGNOSES  Final diagnoses:  Lumbar radiculopathy      NEW MEDICATIONS STARTED DURING THIS VISIT:  ED Discharge Orders         Ordered    predniSONE (STERAPRED UNI-PAK 21 TAB) 10 MG (21) TBPK tablet     08/13/19 2224    methocarbamol (ROBAXIN) 500 MG tablet  Every 8 hours PRN     08/13/19 2224              This chart was dictated using voice recognition software/Dragon. Despite best efforts to proofread, errors can occur which can change the meaning. Any change was purely  unintentional.     Lannie Fields, PA-C 08/13/19 2312    Harvest Dark, MD 08/14/19 0006

## 2019-08-19 ENCOUNTER — Telehealth: Payer: Self-pay | Admitting: Pharmacy Technician

## 2019-08-19 NOTE — Telephone Encounter (Signed)
Patient still needs to provide 2020 [employer] Tax Return and 2021 Bank Statement.  Patient acknowledged that she understood that failure to provide the requested documentation by 11/01/19 will result in a halt in medication assistance from [facility].  [name] Care Manager [facility]

## 2019-11-27 ENCOUNTER — Other Ambulatory Visit: Payer: Self-pay

## 2019-11-27 ENCOUNTER — Emergency Department
Admission: EM | Admit: 2019-11-27 | Discharge: 2019-11-27 | Disposition: A | Discharge status: Home / Self Care | Payer: Self-pay | Attending: Emergency Medicine | Admitting: Emergency Medicine

## 2019-11-27 ENCOUNTER — Emergency Department: Payer: Self-pay

## 2019-11-27 DIAGNOSIS — Z7984 Long term (current) use of oral hypoglycemic drugs: Secondary | ICD-10-CM | POA: Insufficient documentation

## 2019-11-27 DIAGNOSIS — E119 Type 2 diabetes mellitus without complications: Secondary | ICD-10-CM | POA: Insufficient documentation

## 2019-11-27 DIAGNOSIS — I10 Essential (primary) hypertension: Secondary | ICD-10-CM | POA: Insufficient documentation

## 2019-11-27 DIAGNOSIS — R0602 Shortness of breath: Secondary | ICD-10-CM | POA: Insufficient documentation

## 2019-11-27 DIAGNOSIS — R079 Chest pain, unspecified: Secondary | ICD-10-CM

## 2019-11-27 DIAGNOSIS — Z79899 Other long term (current) drug therapy: Secondary | ICD-10-CM | POA: Insufficient documentation

## 2019-11-27 LAB — TROPONIN I (HIGH SENSITIVITY): Troponin I (High Sensitivity): 4 ng/L (ref <18)

## 2019-11-27 LAB — CBC
HCT: 37.8 % (ref 36.0–46.0)
Hemoglobin: 13.8 g/dL (ref 12.0–15.0)
MCH: 32.2 pg (ref 26.0–34.0)
MCHC: 36.5 g/dL — ABNORMAL HIGH (ref 30.0–36.0)
MCV: 88.3 fL (ref 80.0–100.0)
Platelets: 218 10*3/uL (ref 150–400)
RBC: 4.28 MIL/uL (ref 3.87–5.11)
RDW: 12.6 % (ref 11.5–15.5)
WBC: 7.9 10*3/uL (ref 4.0–10.5)
nRBC: 0 % (ref 0.0–0.2)

## 2019-11-27 LAB — BASIC METABOLIC PANEL
Anion gap: 9 (ref 5–15)
BUN: 16 mg/dL (ref 6–20)
CO2: 26 mmol/L (ref 22–32)
Calcium: 9.8 mg/dL (ref 8.9–10.3)
Chloride: 104 mmol/L (ref 98–111)
Creatinine, Ser: 0.84 mg/dL (ref 0.44–1.00)
GFR calc Af Amer: >60 mL/min (ref >60)
GFR calc non Af Amer: >60 mL/min (ref >60)
Glucose, Bld: 124 mg/dL — ABNORMAL HIGH (ref 70–99)
Potassium: 4 mmol/L (ref 3.5–5.1)
Sodium: 139 mmol/L (ref 135–145)

## 2019-11-27 MED ORDER — DICYCLOMINE HCL 20 MG PO TABS: 20.0000 mg | ORAL_TABLET | Freq: Three times a day (TID) | ORAL | 1 refills | Status: AC

## 2019-11-27 MED ORDER — LISINOPRIL 40 MG PO TABS: 40.0000 mg | ORAL_TABLET | Freq: Every day | ORAL | 1 refills | Status: AC

## 2019-11-27 NOTE — ED Triage Notes (Signed)
Pt complains of shob and left sided chest pain radiating to jaw for a week. Pt also states has diarrhea. Pt appears in no acute distress.

## 2019-11-27 NOTE — ED Provider Notes (Signed)
[facility] Emergency Department Provider Note  Time seen: 7:23 AM  I have reviewed the triage vital signs and the nursing notes.   HISTORY  Chief Complaint Chest Pain and Shortness of Breath   HPI Tina Brennan is a 44 y.o. female with a past medical history of diabetes, hypertension, migraines, chronic abdominal discomfort, presents to the emergency department with multiple complaints.  According to the patient over the past week or so she has been experiencing intermittent chest pain and shortness of breath, has been experiencing occasional diarrhea after eating, has had body aches and states she just has "not been feeling well."  Patient states her main concern today is the chest pain she has been experiencing on a daily basis for approximately a week.  States it is more in her bilateral shoulders and somewhat worse with movement.  Patient states it tends to hurt worse throughout the day.  Patient states she is also out of her Bentyl and lisinopril, has not been able to get refills from her doctor because she could not afford the co-pay to see her doctor.   Past Medical History:  Diagnosis Date  . Diabetes mellitus without complication (HCC)   . Hypertension   . IBS (irritable bowel syndrome)   . Kidney stones   . Pancreatitis     Patient Active Problem List   Diagnosis Date Noted  . Hypertension 10/07/2018  . Migraine with status migrainosus 10/07/2018  . IBS (irritable bowel syndrome) 09/23/2018  . Headache 09/23/2018  . Blurred vision, bilateral 09/23/2018  . Healthcare maintenance 09/23/2018  . BMI 33.0-33.9,adult 09/23/2018  . Abdominal pain 02/16/2013  . Diarrhea 02/16/2013  . Nausea and vomiting 02/16/2013    Past Surgical History:  Procedure Laterality Date  . ABDOMINAL HYSTERECTOMY    . BLADDER SURGERY    . cyst removal of ovaries      Prior to Admission medications   Medication Sig Start Date End Date Taking? Authorizing Provider   azithromycin (ZITHROMAX Z-PAK) 250 MG tablet 2 pills today then 1 pill a day for 4 days 05/25/19   Sherrie Mustache, Roselyn Bering, PA-C  Cinnamon 500 MG TABS Take 500 mg by mouth daily.    [provider]  cloNIDine (CATAPRES) 0.1 MG tablet Take 2 tablets (0.2 mg total) by mouth 3 (three) times daily. Patient taking differently: Take 0.2 mg by mouth daily as needed.  10/07/18 10/07/19  Michiel Cowboy A, PA-C  dicyclomine (BENTYL) 20 MG tablet Take 1 tablet (20 mg total) by mouth 4 (four) times daily -  before meals and at bedtime. 11/27/19   Minna Antis, MD  ibuprofen (ADVIL) 200 MG tablet Take 200 mg by mouth every 6 (six) hours as needed.    [provider]  lisinopril (ZESTRIL) 40 MG tablet Take 1 tablet (40 mg total) by mouth daily. 11/27/19   Minna Antis, MD  metFORMIN (GLUCOPHAGE) 500 MG tablet Take 1 tablet (500 mg total) by mouth 2 (two) times daily with a meal. 04/08/19   Bast, Traci A, NP  metoprolol succinate (TOPROL-XL) 100 MG 24 hr tablet TAKE 1 TABLET BY MOUTH ONCE DAILY TAKE  WITH  OR  IMMEDIATELY  FOLLOWING  A  MEAL 04/08/19   Bast, Traci A, NP  omeprazole (PRILOSEC) 20 MG capsule Take 1 capsule (20 mg total) by mouth daily. 09/30/18   Tukov-Yual, Alroy Bailiff, NP  predniSONE (STERAPRED UNI-PAK 21 TAB) 10 MG (21) TBPK tablet Take 6 tablets the first day, take 5 tablets  the second day, take 4 tablets the third day, take 3 tablets the fourth day, take 2 tablets the fifth day, take 1 tablet the sixth day. 08/13/19   Orvil Feil, PA-C  promethazine (PHENERGAN) 25 MG tablet Take 0.5 tablets (12.5 mg total) by mouth every 6 (six) hours as needed for nausea or vomiting. 12/21/18   Minna Antis, MD    Allergies  Allergen Reactions  . Penicillins Anaphylaxis    Patient has taken cephalexin in the past without any reaction.  . Demerol [Meperidine] Hives and Other (See Comments)    Dystonia and Fever Blisters.   . Reglan [Metoclopramide] Other (See Comments)     Dystonia   . Toradol [Ketorolac Tromethamine] Other (See Comments)    Dystonia   . Zofran [Ondansetron Hcl]     Dystonia   . Compazine [Prochlorperazine Edisylate] Palpitations and Other (See Comments)    Dystonia     No family history on file.  Social History Social History   Tobacco Use  . Smoking status: Never Smoker  . Smokeless tobacco: Never Used  Vaping Use  . Vaping Use: Never used  Substance Use Topics  . Alcohol use: No  . Drug use: Not Currently    Types: Marijuana    Comment: occ    Review of Systems Constitutional: Negative for fever Cardiovascular: Bilateral shoulder pain Respiratory: Mild shortness of breath Gastrointestinal: Negative for abdominal pain, vomiting.  Occasional loose stool. Musculoskeletal: Occasional body aches. Skin: Negative for skin complaints  Neurological: Occasional headaches. All other ROS negative  ____________________________________________   PHYSICAL EXAM:  VITAL SIGNS: ED Triage Vitals  Enc Vitals Group     BP 11/27/19 0544 (!) 187/118     Pulse Rate 11/27/19 0544 79     Resp 11/27/19 0544 18     Temp 11/27/19 0544 97.8 F (36.6 C)     Temp Source 11/27/19 0544 Oral     SpO2 11/27/19 0544 100 %     Weight 11/27/19 0538 190 lb (86.2 kg)     Height 11/27/19 0538 5\' 5"  (1.651 m)     Head Circumference --      Peak Flow --      Pain Score 11/27/19 0538 6     Pain Loc --      Pain Edu? --      Excl. in GC? --     Constitutional: Alert and oriented. Well appearing and in no distress. Eyes: Normal exam ENT      Head: Normocephalic and atraumatic.      Mouth/Throat: Mucous membranes are moist. Cardiovascular: Normal rate, regular rhythm.  Respiratory: Normal respiratory effort without tachypnea nor retractions. Breath sounds are clear.  Chest wall is nontender. Gastrointestinal: Soft and nontender. No distention. Musculoskeletal: Nontender with normal range of motion in all extremities.  Neurologic:  Normal  speech and language. No gross focal neurologic deficits  Skin:  Skin is warm, dry and intact.  Psychiatric: Mood and affect are normal.   ____________________________________________    EKG  EKG viewed and interpreted by myself shows a normal sinus rhythm at 77 bpm with a narrow QRS, normal axis, normal intervals, no concerning ST changes.  Reassuring EKG.  ____________________________________________    RADIOLOGY  Chest x-ray is normal  ____________________________________________   INITIAL IMPRESSION / ASSESSMENT AND PLAN / ED COURSE  Pertinent labs & imaging results that were available during my care of the patient were reviewed by me and considered in my medical decision making (  see chart for details).   Patient presents to the emergency department with complaints of intermittent chest pain/shoulder pain, shortness of breath, occasional loose stool, headaches and body aches.  Denies fever.  Denies any pleuritic pain.  Overall patient appears well she is moderately hypertensive otherwise reassuring vitals.  Patient's work-up today is reassuring including a negative troponin reassuring x-ray and EKG.  Patient appears well on physical exam.  We will discharge patient home with PCP follow-up.  We will refill the patient's lisinopril and Bentyl.  Patient agreeable to plan.  Leslie Fausto was evaluated in Emergency Department on 11/27/2019 for the symptoms described in the history of present illness. She was evaluated in the context of the global COVID-19 pandemic, which necessitated consideration that the patient might be at risk for infection with the SARS-CoV-2 virus that causes COVID-19. Institutional protocols and algorithms that pertain to the evaluation of patients at risk for COVID-19 are in a state of rapid change based on information released by regulatory bodies including the CDC and federal and state organizations. These policies and algorithms were followed during the  patient's care in the ED.  ____________________________________________   FINAL CLINICAL IMPRESSION(S) / ED DIAGNOSES  Chest pain   Harvest Dark, MD 11/27/19 [phone]

## 2020-02-27 ENCOUNTER — Other Ambulatory Visit: Payer: Self-pay | Admitting: Family Medicine

## 2020-04-16 ENCOUNTER — Other Ambulatory Visit: Payer: Self-pay | Admitting: Family Medicine

## 2020-05-07 ENCOUNTER — Other Ambulatory Visit: Payer: Self-pay | Admitting: Family Medicine

## 2020-06-02 DIAGNOSIS — U071 COVID-19: Secondary | ICD-10-CM

## 2020-06-02 HISTORY — DX: COVID-19: U07.1

## 2020-06-06 ENCOUNTER — Other Ambulatory Visit: Payer: Self-pay | Admitting: Family Medicine

## 2020-06-10 ENCOUNTER — Emergency Department: Payer: Self-pay

## 2020-06-10 ENCOUNTER — Emergency Department
Admission: EM | Admit: 2020-06-10 | Discharge: 2020-06-10 | Disposition: A | Discharge status: Home / Self Care | Payer: Self-pay | Attending: Student in an Organized Health Care Education/Training Program | Admitting: Student in an Organized Health Care Education/Training Program

## 2020-06-10 ENCOUNTER — Other Ambulatory Visit: Payer: Self-pay

## 2020-06-10 ENCOUNTER — Encounter: Payer: Self-pay | Admitting: Emergency Medicine

## 2020-06-10 DIAGNOSIS — E119 Type 2 diabetes mellitus without complications: Secondary | ICD-10-CM | POA: Insufficient documentation

## 2020-06-10 DIAGNOSIS — Z79899 Other long term (current) drug therapy: Secondary | ICD-10-CM | POA: Insufficient documentation

## 2020-06-10 DIAGNOSIS — M25551 Pain in right hip: Secondary | ICD-10-CM | POA: Insufficient documentation

## 2020-06-10 DIAGNOSIS — Z7984 Long term (current) use of oral hypoglycemic drugs: Secondary | ICD-10-CM | POA: Insufficient documentation

## 2020-06-10 DIAGNOSIS — I10 Essential (primary) hypertension: Secondary | ICD-10-CM | POA: Insufficient documentation

## 2020-06-10 DIAGNOSIS — M545 Low back pain, unspecified: Secondary | ICD-10-CM | POA: Insufficient documentation

## 2020-06-10 LAB — URINALYSIS, COMPLETE (UACMP) WITH MICROSCOPIC
Bacteria, UA: NONE SEEN
Bilirubin Urine: NEGATIVE
Glucose, UA: NEGATIVE mg/dL
Hgb urine dipstick: NEGATIVE
Ketones, ur: NEGATIVE mg/dL
Nitrite: NEGATIVE
Protein, ur: NEGATIVE mg/dL
Specific Gravity, Urine: 1.016 (ref 1.005–1.030)
pH: 5 (ref 5.0–8.0)

## 2020-06-10 NOTE — ED Provider Notes (Signed)
[facility] Emergency Department Provider Note  ____________________________________________  Time seen: Approximately 5:22 PM  I have reviewed the triage vital signs and the nursing notes.   HISTORY  Chief Complaint Hip Pain    HPI Tina Brennan is a 45 y.o. female that presents to emergency department for evaluation of right hip pain for several months.  Patient states that it feels like something is out of place.  Pain is worse with certain seated positions.  She has difficulty laying on her right hip due to pain.  Patient states that pain starts in her hip and radiates down the right side of her thigh.  Pain is starting to move into her right low back.  She denies any trauma.  She has seen primary care for this and they have put her on gabapentin.  No bowel or bladder dysfunction or saddle anesthesias.  No fevers, abdominal pain, urinary symptoms.   Past Medical History:  Diagnosis Date  . Diabetes mellitus without complication (HCC)   . Hypertension   . IBS (irritable bowel syndrome)   . Kidney stones   . Pancreatitis     Patient Active Problem List   Diagnosis Date Noted  . Hypertension 10/07/2018  . Migraine with status migrainosus 10/07/2018  . IBS (irritable bowel syndrome) 09/23/2018  . Headache 09/23/2018  . Blurred vision, bilateral 09/23/2018  . Healthcare maintenance 09/23/2018  . BMI 33.0-33.9,adult 09/23/2018  . Abdominal pain 02/16/2013  . Diarrhea 02/16/2013  . Nausea and vomiting 02/16/2013    Past Surgical History:  Procedure Laterality Date  . ABDOMINAL HYSTERECTOMY    . BLADDER SURGERY    . cyst removal of ovaries      Prior to Admission medications   Medication Sig Start Date End Date Taking? Authorizing Provider  azithromycin (ZITHROMAX Z-PAK) 250 MG tablet 2 pills today then 1 pill a day for 4 days 05/25/19   Sherrie Mustache, Roselyn Bering, PA-C  Cinnamon 500 MG TABS Take 500 mg by mouth daily.    [provider]   cloNIDine (CATAPRES) 0.1 MG tablet Take 2 tablets (0.2 mg total) by mouth 3 (three) times daily. Patient taking differently: Take 0.2 mg by mouth daily as needed.  10/07/18 10/07/19  Michiel Cowboy A, PA-C  dicyclomine (BENTYL) 20 MG tablet Take 1 tablet (20 mg total) by mouth 4 (four) times daily -  before meals and at bedtime. 11/27/19   Minna Antis, MD  ibuprofen (ADVIL) 200 MG tablet Take 200 mg by mouth every 6 (six) hours as needed.    [provider]  lisinopril (ZESTRIL) 40 MG tablet Take 1 tablet (40 mg total) by mouth daily. 11/27/19   Minna Antis, MD  metFORMIN (GLUCOPHAGE) 500 MG tablet Take 1 tablet (500 mg total) by mouth 2 (two) times daily with a meal. 04/08/19   Bast, Traci A, NP  metoprolol succinate (TOPROL-XL) 100 MG 24 hr tablet TAKE 1 TABLET BY MOUTH ONCE DAILY TAKE  WITH  OR  IMMEDIATELY  FOLLOWING  A  MEAL 04/08/19   Bast, Traci A, NP  omeprazole (PRILOSEC) 20 MG capsule Take 1 capsule (20 mg total) by mouth daily. 09/30/18   Tukov-Yual, Alroy Bailiff, NP  predniSONE (STERAPRED UNI-PAK 21 TAB) 10 MG (21) TBPK tablet Take 6 tablets the first day, take 5 tablets the second day, take 4 tablets the third day, take 3 tablets the fourth day, take 2 tablets the fifth day, take 1 tablet the sixth day. 08/13/19   Pia Mau  M, PA-C  promethazine (PHENERGAN) 25 MG tablet Take 0.5 tablets (12.5 mg total) by mouth every 6 (six) hours as needed for nausea or vomiting. 12/21/18   Minna Antis, MD    Allergies Penicillins, Demerol [meperidine], Reglan [metoclopramide], Toradol [ketorolac tromethamine], Zofran [ondansetron hcl], and Compazine [prochlorperazine edisylate]  No family history on file.  Social History Social History   Tobacco Use  . Smoking status: Never Smoker  . Smokeless tobacco: Never Used  Vaping Use  . Vaping Use: Never used  Substance Use Topics  . Alcohol use: No  . Drug use: Not Currently    Types: Marijuana    Comment: occ      Review of Systems  Constitutional: No fever/chills ENT: No upper respiratory complaints. Cardiovascular: No chest pain. Respiratory: No cough. No SOB. Gastrointestinal: No abdominal pain.  No nausea, no vomiting.  Musculoskeletal: Positive for hip pain. Skin: Negative for rash, abrasions, lacerations, ecchymosis. Neurological: Negative for headaches, numbness or tingling   ____________________________________________   PHYSICAL EXAM:  VITAL SIGNS: ED Triage Vitals  Enc Vitals Group     BP 06/10/20 1622 (!) 146/92     Pulse Rate 06/10/20 1622 96     Resp 06/10/20 1622 20     Temp 06/10/20 1622 98.9 F (37.2 C)     Temp Source 06/10/20 1622 Oral     SpO2 06/10/20 1622 97 %     Weight 06/10/20 1614 197 lb (89.4 kg)     Height 06/10/20 1614 5\' 5"  (1.651 m)     Head Circumference --      Peak Flow --      Pain Score 06/10/20 1614 3     Pain Loc --      Pain Edu? --      Excl. in GC? --      Constitutional: Alert and oriented. Well appearing and in no acute distress. Eyes: Conjunctivae are normal. PERRL. EOMI. Head: Atraumatic. ENT:      Ears:      Nose: No congestion/rhinnorhea.      Mouth/Throat: Mucous membranes are moist.  Neck: No stridor.   Cardiovascular: Normal rate, regular rhythm.  Good peripheral circulation. Respiratory: Normal respiratory effort without tachypnea or retractions. Lungs CTAB. Good air entry to the bases with no decreased or absent breath sounds. Gastrointestinal: Bowel sounds 4 quadrants. Soft and nontender to palpation. No guarding or rigidity. No palpable masses. No distention.  Musculoskeletal: Full range of motion to all extremities. No gross deformities appreciated.  Full range of motion of right hip.  Tenderness to palpation to right lumbar paraspinal muscles, right buttocks, right trochanteric bursa.  Normal gait. Neurologic:  Normal speech and language. No gross focal neurologic deficits are appreciated.  Skin:  Skin is warm,  dry and intact. No rash noted. Psychiatric: Mood and affect are normal. Speech and behavior are normal. Patient exhibits appropriate insight and judgement.   ____________________________________________   LABS (all labs ordered are listed, but only abnormal results are displayed)  Labs Reviewed  URINALYSIS, COMPLETE (UACMP) WITH MICROSCOPIC   ____________________________________________  EKG   ____________________________________________  RADIOLOGY 08/08/20, personally viewed and evaluated these images (plain radiographs) as part of my medical decision making, as well as reviewing the written report by the radiologist.  DG Lumbar Spine 2-3 Views  Result Date: 06/10/2020 CLINICAL DATA:  Right hip pain. EXAM: LUMBAR SPINE - 2-3 VIEW COMPARISON:  CT abdomen pelvis dated February 28, 2019. FINDINGS: Five lumbar type vertebral bodies.  Hypoplastic  ribs at T12. No acute fracture or subluxation. Vertebral body heights are preserved. Alignment is normal. Unchanged moderate disc height loss at L1-L2. The sacroiliac joints are unremarkable. IMPRESSION: 1. Unchanged moderate degenerative disc disease at L1-L2. Electronically Signed   By: Obie Dredge M.D.   On: 06/10/2020 17:47   DG Hip Unilat W or Wo Pelvis 2-3 Views Right  Result Date: 06/10/2020 CLINICAL DATA:  Right hip pain. EXAM: DG HIP (WITH OR WITHOUT PELVIS) 2-3V RIGHT COMPARISON:  CT abdomen pelvis dated February 28, 2019. FINDINGS: There is no evidence of hip fracture or dislocation. There is no evidence of arthropathy or other focal bone abnormality. IMPRESSION: Negative. Electronically Signed   By: Obie Dredge M.D.   On: 06/10/2020 17:45    ____________________________________________    PROCEDURES  Procedure(s) performed:    Procedures    Medications - No data to display   ____________________________________________   INITIAL IMPRESSION / ASSESSMENT AND PLAN / ED COURSE  Pertinent labs & imaging  results that were available during my care of the patient were reviewed by me and considered in my medical decision making (see chart for details).  Review of the [place] CSRS was performed in accordance of the [employer] prior to dispensing any controlled drugs.   Patient presented to the emergency department for evaluation of right hip pain for several months. Vital signs and exam are reassuring. Hip x-ray negative for acute bony abnormalities. Lumbar x-ray shows unchanged moderate degenerative disc disease at L1-L2. Patient declines any medications in the emergency department. She would like to follow-up with a specialist for possible physical therapy or chiropractor referral in the future. Patient is to follow up with orthopedics as directed. Referral was given to Dr. Yves Dill. Patient is given ED precautions to return to the ED for any worsening or new symptoms.  Ashyra Terris was evaluated in Emergency Department on 06/10/2020 for the symptoms described in the history of present illness. She was evaluated in the context of the global COVID-19 pandemic, which necessitated consideration that the patient might be at risk for infection with the SARS-CoV-2 virus that causes COVID-19. Institutional protocols and algorithms that pertain to the evaluation of patients at risk for COVID-19 are in a state of rapid change based on information released by regulatory bodies including the CDC and federal and state organizations. These policies and algorithms were followed during the patient's care in the ED.   ____________________________________________  FINAL CLINICAL IMPRESSION(S) / ED DIAGNOSES  Final diagnoses:  Right hip pain      NEW MEDICATIONS STARTED DURING THIS VISIT:  ED Discharge Orders    None          This chart was dictated using voice recognition software/Dragon. Despite best efforts to proofread, errors can occur which can change the meaning. Any change was purely unintentional.     Enid Derry, PA-C 06/10/20 Vertell Novak, MD 06/10/20 [phone]

## 2020-06-10 NOTE — ED Triage Notes (Signed)
Pt reports pain to her right hip for the past 4 months. Pt states she cannot sit [place] style or lay on that side. Pt reports her PMD just keeps increasing the dose of her gabapentin but nothing is working

## 2020-06-10 NOTE — ED Notes (Signed)
Patient is not in room for discharge. 

## 2020-06-12 LAB — URINE CULTURE: Culture: NO GROWTH

## 2020-06-20 ENCOUNTER — Emergency Department: Payer: HRSA Program

## 2020-06-20 ENCOUNTER — Emergency Department
Admission: EM | Admit: 2020-06-20 | Discharge: 2020-06-20 | Disposition: A | Discharge status: Home / Self Care | Payer: HRSA Program | Attending: Emergency Medicine | Admitting: Emergency Medicine

## 2020-06-20 ENCOUNTER — Other Ambulatory Visit: Payer: Self-pay

## 2020-06-20 DIAGNOSIS — Z7984 Long term (current) use of oral hypoglycemic drugs: Secondary | ICD-10-CM | POA: Diagnosis not present

## 2020-06-20 DIAGNOSIS — E119 Type 2 diabetes mellitus without complications: Secondary | ICD-10-CM | POA: Insufficient documentation

## 2020-06-20 DIAGNOSIS — J069 Acute upper respiratory infection, unspecified: Secondary | ICD-10-CM

## 2020-06-20 DIAGNOSIS — Z79899 Other long term (current) drug therapy: Secondary | ICD-10-CM | POA: Insufficient documentation

## 2020-06-20 DIAGNOSIS — U071 COVID-19: Secondary | ICD-10-CM | POA: Insufficient documentation

## 2020-06-20 DIAGNOSIS — I10 Essential (primary) hypertension: Secondary | ICD-10-CM | POA: Insufficient documentation

## 2020-06-20 DIAGNOSIS — R059 Cough, unspecified: Secondary | ICD-10-CM | POA: Diagnosis present

## 2020-06-20 NOTE — ED Provider Notes (Signed)
[facility]-EMERGENCY DEPARTMENT  ____________________________________________  Time seen: Approximately 4:30 PM  I have reviewed the triage vital signs and the nursing notes.   HISTORY  Chief Complaint Cough   Historian Patient     HPI Tina Brennan is a 45 y.o. female with a history of diabetes and hypertension, presents to the emergency department with viral URI-like symptoms for the past 2 to 3 days.  Patient reports that multiple of her coworkers have tested positive within the past 48 hours.  Patient states that even her primary care provider tested positive for COVID-19.  She states that she has had occasional shortness of breath but has a nebulizer at home.  She denies chest tightness or chest pain.  No nausea, vomiting or abdominal pain.  She has had some chills.   Past Medical History:  Diagnosis Date  . Diabetes mellitus without complication (HCC)   . Hypertension   . IBS (irritable bowel syndrome)   . Kidney stones   . Pancreatitis      Immunizations up to date:  Yes.     Past Medical History:  Diagnosis Date  . Diabetes mellitus without complication (HCC)   . Hypertension   . IBS (irritable bowel syndrome)   . Kidney stones   . Pancreatitis     Patient Active Problem List   Diagnosis Date Noted  . Hypertension 10/07/2018  . Migraine with status migrainosus 10/07/2018  . IBS (irritable bowel syndrome) 09/23/2018  . Headache 09/23/2018  . Blurred vision, bilateral 09/23/2018  . Healthcare maintenance 09/23/2018  . BMI 33.0-33.9,adult 09/23/2018  . Abdominal pain 02/16/2013  . Diarrhea 02/16/2013  . Nausea and vomiting 02/16/2013    Past Surgical History:  Procedure Laterality Date  . ABDOMINAL HYSTERECTOMY    . BLADDER SURGERY    . cyst removal of ovaries      Prior to Admission medications   Medication Sig Start Date End Date Taking? Authorizing Provider  azithromycin (ZITHROMAX Z-PAK) 250 MG tablet 2 pills today then 1 pill a day for 4  days 05/25/19   Sherrie Mustache, Roselyn Bering, PA-C  Cinnamon 500 MG TABS Take 500 mg by mouth daily.    [provider]  cloNIDine (CATAPRES) 0.1 MG tablet Take 2 tablets (0.2 mg total) by mouth 3 (three) times daily. Patient taking differently: Take 0.2 mg by mouth daily as needed.  10/07/18 10/07/19  Michiel Cowboy A, PA-C  dicyclomine (BENTYL) 20 MG tablet Take 1 tablet (20 mg total) by mouth 4 (four) times daily -  before meals and at bedtime. 11/27/19   Minna Antis, MD  ibuprofen (ADVIL) 200 MG tablet Take 200 mg by mouth every 6 (six) hours as needed.    [provider]  lisinopril (ZESTRIL) 40 MG tablet Take 1 tablet (40 mg total) by mouth daily. 11/27/19   Minna Antis, MD  metFORMIN (GLUCOPHAGE) 500 MG tablet Take 1 tablet (500 mg total) by mouth 2 (two) times daily with a meal. 04/08/19   Bast, Traci A, NP  metoprolol succinate (TOPROL-XL) 100 MG 24 hr tablet TAKE 1 TABLET BY MOUTH ONCE DAILY TAKE  WITH  OR  IMMEDIATELY  FOLLOWING  A  MEAL 04/08/19   Bast, Traci A, NP  omeprazole (PRILOSEC) 20 MG capsule Take 1 capsule (20 mg total) by mouth daily. 09/30/18   Tukov-Yual, Alroy Bailiff, NP  predniSONE (STERAPRED UNI-PAK 21 TAB) 10 MG (21) TBPK tablet Take 6 tablets the first day, take 5 tablets the second day, take 4 tablets the  third day, take 3 tablets the fourth day, take 2 tablets the fifth day, take 1 tablet the sixth day. 08/13/19   Orvil Feil, PA-C  promethazine (PHENERGAN) 25 MG tablet Take 0.5 tablets (12.5 mg total) by mouth every 6 (six) hours as needed for nausea or vomiting. 12/21/18   Minna Antis, MD    Allergies Penicillins, Demerol [meperidine], Reglan [metoclopramide], Toradol [ketorolac tromethamine], Zofran [ondansetron hcl], and Compazine [prochlorperazine edisylate]  No family history on file.  Social History Social History   Tobacco Use  . Smoking status: Never Smoker  . Smokeless tobacco: Never Used  Vaping Use  . Vaping Use: Never used   Substance Use Topics  . Alcohol use: No  . Drug use: Not Currently    Types: Marijuana    Comment: occ      Review of Systems  Constitutional: Patient has fever.  Eyes: No visual changes. No discharge ENT: Patient has congestion.  Cardiovascular: no chest pain. Respiratory: Patient has cough.  Gastrointestinal: No abdominal pain.  No nausea, no vomiting. Patient had diarrhea.  Genitourinary: Negative for dysuria. No hematuria Musculoskeletal: Patient has myalgias.  Skin: Negative for rash, abrasions, lacerations, ecchymosis. Neurological: Patient has headache, no focal weakness or numbness.     ____________________________________________   PHYSICAL EXAM:  VITAL SIGNS: ED Triage Vitals  Enc Vitals Group     BP 06/20/20 1429 125/87     Pulse Rate 06/20/20 1429 99     Resp 06/20/20 1429 18     Temp 06/20/20 1429 98.7 F (37.1 C)     Temp Source 06/20/20 1429 Oral     SpO2 06/20/20 1429 96 %     Weight 06/20/20 1430 198 lb (89.8 kg)     Height 06/20/20 1430 5\' 5"  (1.651 m)     Head Circumference --      Peak Flow --      Pain Score 06/20/20 1438 0     Pain Loc --      Pain Edu? --      Excl. in GC? --      Constitutional: Alert and oriented. Patient is lying supine. Eyes: Conjunctivae are normal. PERRL. EOMI. Head: Atraumatic. ENT:      Ears: Tympanic membranes are mildly injected with mild effusion bilaterally.       Nose: No congestion/rhinnorhea.      Mouth/Throat: Mucous membranes are moist. Posterior pharynx is mildly erythematous.  Hematological/Lymphatic/Immunilogical: No cervical lymphadenopathy.  Cardiovascular: Normal rate, regular rhythm. Normal S1 and S2.  Good peripheral circulation. Respiratory: Normal respiratory effort without tachypnea or retractions. Lungs CTAB. Good air entry to the bases with no decreased or absent breath sounds. Gastrointestinal: Bowel sounds 4 quadrants. Soft and nontender to palpation. No guarding or rigidity. No  palpable masses. No distention. No CVA tenderness. Musculoskeletal: Full range of motion to all extremities. No gross deformities appreciated. Neurologic:  Normal speech and language. No gross focal neurologic deficits are appreciated.  Skin:  Skin is warm, dry and intact. No rash noted. Psychiatric: Mood and affect are normal. Speech and behavior are normal. Patient exhibits appropriate insight and judgement.   ____________________________________________   LABS (all labs ordered are listed, but only abnormal results are displayed)  Labs Reviewed  SARS CORONAVIRUS 2 (TAT 6-24 HRS)   ____________________________________________  EKG   ____________________________________________  RADIOLOGY 06/22/20, personally viewed and evaluated these images (plain radiographs) as part of my medical decision making, as well as reviewing the written report by  the radiologist.  DG Chest 2 View  Result Date: 06/20/2020 CLINICAL DATA:  Short of breath. EXAM: CHEST - 2 VIEW COMPARISON:  11/27/2019 FINDINGS: The heart size and mediastinal contours are within normal limits. Both lungs are clear. The visualized skeletal structures are unremarkable. IMPRESSION: No active cardiopulmonary disease. Electronically Signed   By: Marlan Palau M.D.   On: 06/20/2020 15:05    ____________________________________________    PROCEDURES  Procedure(s) performed:     Procedures     Medications - No data to display   ____________________________________________   INITIAL IMPRESSION / ASSESSMENT AND PLAN / ED COURSE  Pertinent labs & imaging results that were available during my care of the patient were reviewed by me and considered in my medical decision making (see chart for details).      Assessment and plan COVID-19 exposure 45 year old female presents to the emergency department with COVID-like symptoms for the past 1 to 2 days.  Vital signs are reassuring at triage.  On physical  exam, patient was alert, active and nontoxic-appearing with no increased work of breathing.  Send out COVID-19 testing is in process at this time.  Rest and hydration were encouraged at home.  Tylenol and ibuprofen alternating were recommended for body aches.  Return precautions were given to return with new or worsening symptoms.     ____________________________________________  FINAL CLINICAL IMPRESSION(S) / ED DIAGNOSES  Final diagnoses:  Viral URI with cough      NEW MEDICATIONS STARTED DURING THIS VISIT:  ED Discharge Orders    None          This chart was dictated using voice recognition software/Dragon. Despite best efforts to proofread, errors can occur which can change the meaning. Any change was purely unintentional.     Orvil Feil, PA-C 06/20/20 1632    Gilles Chiquito, MD 06/20/20 [phone]

## 2020-06-20 NOTE — ED Triage Notes (Signed)
Pt here with covid like symptoms. Pt has been near coworkers that tested positive last week but were at work. Pt's symptoms started on yesterday. Pt in NAD in triage.

## 2020-06-21 LAB — SARS CORONAVIRUS 2 (TAT 6-24 HRS): SARS Coronavirus 2: POSITIVE — AB

## 2020-06-22 ENCOUNTER — Telehealth: Payer: Self-pay

## 2020-06-22 NOTE — Telephone Encounter (Signed)
Called to discuss with patient about COVID-19 symptoms and the use of one of the available treatments for those with mild to moderate Covid symptoms and at a high risk of hospitalization.  Pt appears to qualify for outpatient treatment due to co-morbid conditions and/or a member of an at-risk group in accordance with the FDA Emergency Use Authorization.    Symptom onset: 06/17/20 Vaccinated: No Booster? No Immunocompromised? No Qualifiers: DM,HTN  Unable to reach pt - Reached pt. She is not interested in further treatment. States she is feeling better.   Tina Brennan

## 2020-06-25 ENCOUNTER — Emergency Department: Payer: Self-pay

## 2020-06-25 ENCOUNTER — Emergency Department
Admission: EM | Admit: 2020-06-25 | Discharge: 2020-06-25 | Disposition: A | Discharge status: Home / Self Care | Payer: Self-pay | Attending: Emergency Medicine | Admitting: Emergency Medicine

## 2020-06-25 ENCOUNTER — Other Ambulatory Visit: Payer: Self-pay

## 2020-06-25 DIAGNOSIS — U071 COVID-19: Secondary | ICD-10-CM | POA: Insufficient documentation

## 2020-06-25 DIAGNOSIS — I10 Essential (primary) hypertension: Secondary | ICD-10-CM | POA: Insufficient documentation

## 2020-06-25 DIAGNOSIS — Z7984 Long term (current) use of oral hypoglycemic drugs: Secondary | ICD-10-CM | POA: Insufficient documentation

## 2020-06-25 DIAGNOSIS — Z79899 Other long term (current) drug therapy: Secondary | ICD-10-CM | POA: Insufficient documentation

## 2020-06-25 DIAGNOSIS — E119 Type 2 diabetes mellitus without complications: Secondary | ICD-10-CM | POA: Insufficient documentation

## 2020-06-25 DIAGNOSIS — R42 Dizziness and giddiness: Secondary | ICD-10-CM

## 2020-06-25 LAB — CBG MONITORING, ED: Glucose-Capillary: 117 mg/dL — ABNORMAL HIGH (ref 70–99)

## 2020-06-25 NOTE — ED Notes (Signed)
Says no cough but it feels like pain after coughing.  Says feels lightheaded.  No fever.  Has been drinking, but not eating.

## 2020-06-25 NOTE — ED Triage Notes (Signed)
Pt states she was dx with Covid on 1/19 and had a fever states she was feeling better but today feeling lightheaded and "feels funny on the right side of my chest when I take a deep breath" states it doesnt feel like congestion, just feels like I cant get a full breath:

## 2020-06-25 NOTE — ED Provider Notes (Signed)
[facility] Emergency Department Provider Note  ____________________________________________   Event Date/Time   First MD Initiated Contact with Patient 06/25/20 1401     (approximate)  I have reviewed the triage vital signs and the nursing notes.   HISTORY  Chief Complaint Dizziness    HPI Tina Brennan is a 45 y.o. female presents emergency department stating that she had some dizziness.  Patient has not been eating very much been drinking 7-Up.  Patient is diabetic and positive for Covid on  06/21/2020.  She denies chest pain or shortness of breath.  No pain in her legs.  States she does have some sores around her mouth.  Has not been taking any over-the-counter medications.   Past Medical History:  Diagnosis Date  . Diabetes mellitus without complication (HCC)   . Hypertension   . IBS (irritable bowel syndrome)   . Kidney stones   . Pancreatitis     Patient Active Problem List   Diagnosis Date Noted  . Hypertension 10/07/2018  . Migraine with status migrainosus 10/07/2018  . IBS (irritable bowel syndrome) 09/23/2018  . Headache 09/23/2018  . Blurred vision, bilateral 09/23/2018  . Healthcare maintenance 09/23/2018  . BMI 33.0-33.9,adult 09/23/2018  . Abdominal pain 02/16/2013  . Diarrhea 02/16/2013  . Nausea and vomiting 02/16/2013    Past Surgical History:  Procedure Laterality Date  . ABDOMINAL HYSTERECTOMY    . BLADDER SURGERY    . cyst removal of ovaries      Prior to Admission medications   Medication Sig Start Date End Date Taking? Authorizing Provider  Cinnamon 500 MG TABS Take 500 mg by mouth daily.    [provider]  cloNIDine (CATAPRES) 0.1 MG tablet Take 2 tablets (0.2 mg total) by mouth 3 (three) times daily. Patient taking differently: Take 0.2 mg by mouth daily as needed.  10/07/18 10/07/19  Michiel Cowboy A, PA-C  dicyclomine (BENTYL) 20 MG tablet Take 1 tablet (20 mg total) by mouth 4 (four) times daily -   before meals and at bedtime. 11/27/19   Minna Antis, MD  ibuprofen (ADVIL) 200 MG tablet Take 200 mg by mouth every 6 (six) hours as needed.    [provider]  lisinopril (ZESTRIL) 40 MG tablet Take 1 tablet (40 mg total) by mouth daily. 11/27/19   Minna Antis, MD  metFORMIN (GLUCOPHAGE) 500 MG tablet Take 1 tablet (500 mg total) by mouth 2 (two) times daily with a meal. 04/08/19   Bast, Traci A, NP  metoprolol succinate (TOPROL-XL) 100 MG 24 hr tablet TAKE 1 TABLET BY MOUTH ONCE DAILY TAKE  WITH  OR  IMMEDIATELY  FOLLOWING  A  MEAL 04/08/19   Bast, Traci A, NP  omeprazole (PRILOSEC) 20 MG capsule Take 1 capsule (20 mg total) by mouth daily. 09/30/18   Tukov-Yual, Alroy Bailiff, NP  predniSONE (STERAPRED UNI-PAK 21 TAB) 10 MG (21) TBPK tablet Take 6 tablets the first day, take 5 tablets the second day, take 4 tablets the third day, take 3 tablets the fourth day, take 2 tablets the fifth day, take 1 tablet the sixth day. 08/13/19   Orvil Feil, PA-C  promethazine (PHENERGAN) 25 MG tablet Take 0.5 tablets (12.5 mg total) by mouth every 6 (six) hours as needed for nausea or vomiting. 12/21/18   Minna Antis, MD    Allergies Penicillins, Demerol [meperidine], Reglan [metoclopramide], Toradol [ketorolac tromethamine], Zofran [ondansetron hcl], and Compazine [prochlorperazine edisylate]  No family history on file.  Social  History Social History   Tobacco Use  . Smoking status: Never Smoker  . Smokeless tobacco: Never Used  Vaping Use  . Vaping Use: Never used  Substance Use Topics  . Alcohol use: No  . Drug use: Not Currently    Types: Marijuana    Comment: occ    Review of Systems  Constitutional: No fever/chills Eyes: No visual changes. ENT: No sore throat. Respiratory: Denies cough Cardiovascular: Denies chest pain Gastrointestinal: Denies abdominal pain Genitourinary: Negative for dysuria. Musculoskeletal: Negative for back pain. Skin: Negative for  rash. Psychiatric: no mood changes,     ____________________________________________   PHYSICAL EXAM:  VITAL SIGNS: ED Triage Vitals  Enc Vitals Group     BP 06/25/20 1302 (!) 140/91     Pulse Rate 06/25/20 1302 93     Resp 06/25/20 1302 18     Temp 06/25/20 1302 98.8 F (37.1 C)     Temp Source 06/25/20 1302 Oral     SpO2 06/25/20 1302 97 %     Weight 06/25/20 1307 198 lb (89.8 kg)     Height 06/25/20 1307 5\' 5"  (1.651 m)     Head Circumference --      Peak Flow --      Pain Score 06/25/20 1307 0     Pain Loc --      Pain Edu? --      Excl. in GC? --     Constitutional: Alert and oriented. Well appearing and in no acute distress. Eyes: Conjunctivae are normal.  Head: Atraumatic. Nose: No congestion/rhinnorhea. Mouth/Throat: Mucous membranes are moist.  Neck:  supple no lymphadenopathy noted Cardiovascular: Normal rate, regular rhythm. Heart sounds are normal Respiratory: Normal respiratory effort.  No retractions, lungs c t a  GU: deferred Musculoskeletal: FROM all extremities, warm and well perfused Neurologic:  Normal speech and language.  Skin:  Skin is warm, dry and intact. No rash noted. Psychiatric: Mood and affect are normal. Speech and behavior are normal.  ____________________________________________   LABS (all labs ordered are listed, but only abnormal results are displayed)  Labs Reviewed  CBG MONITORING, ED - Abnormal; Notable for the following components:      Result Value   Glucose-Capillary 117 (*)    All other components within normal limits   ____________________________________________   ____________________________________________  RADIOLOGY    ____________________________________________   PROCEDURES  Procedure(s) performed: No  Procedures    ____________________________________________   INITIAL IMPRESSION / ASSESSMENT AND PLAN / ED COURSE  Pertinent labs & imaging results that were available during my care of the  patient were reviewed by me and considered in my medical decision making (see chart for details).   Patient recently 45 year old female with positive for Covid, history of diabetes and hypertension presents with dizziness.  See HPI.  Physical exam is unremarkable.  Patient's biggest concern was that if she had Covid pneumonia.  Her chest x-ray is normal.  This was reviewed by me and confirmed by radiology  CBG is 117.  Patient states she actually feels better after drinking 2 cups of water while here in the ED.  Plan to her that she may just be getting dehydrated as she has been drinking 7-Up instead of water.  Gave her a list of vitamins to help with her symptoms.  She is to return if worsening.  She was discharged in stable condition.     Lanasia Funk was evaluated in Emergency Department on 06/25/2020 for the symptoms described in the  history of present illness. She was evaluated in the context of the global COVID-19 pandemic, which necessitated consideration that the patient might be at risk for infection with the SARS-CoV-2 virus that causes COVID-19. Institutional protocols and algorithms that pertain to the evaluation of patients at risk for COVID-19 are in a state of rapid change based on information released by regulatory bodies including the CDC and federal and state organizations. These policies and algorithms were followed during the patient's care in the ED.    As part of my medical decision making, I reviewed the following data within the electronic MEDICAL RECORD NUMBER Nursing notes reviewed and incorporated, Labs reviewed , Old chart reviewed, Radiograph reviewed , Notes from prior ED visits and [place] Controlled Substance Database  ____________________________________________   FINAL CLINICAL IMPRESSION(S) / ED DIAGNOSES  Final diagnoses:  Lightheadedness  COVID-19      NEW MEDICATIONS STARTED DURING THIS VISIT:  Discharge Medication List as of 06/25/2020  3:25 PM        Note:  This document was prepared using Dragon voice recognition software and may include unintentional dictation errors.    Faythe Ghee, PA-C 06/25/20 1541    Minna Antis, MD 06/29/20 [phone]

## 2020-06-25 NOTE — ED Notes (Signed)
Pt ambulated to bathroom with steady gait and O2 remained at 98% respirations even and unlabored.

## 2020-06-29 ENCOUNTER — Ambulatory Visit: Payer: Self-pay | Admitting: Pharmacist

## 2020-06-29 ENCOUNTER — Other Ambulatory Visit: Payer: Self-pay | Admitting: Family Medicine

## 2020-06-29 ENCOUNTER — Other Ambulatory Visit: Payer: Self-pay

## 2020-06-29 DIAGNOSIS — Z79899 Other long term (current) drug therapy: Secondary | ICD-10-CM

## 2020-06-29 NOTE — Progress Notes (Signed)
Medication Management Clinic Visit Note  Patient: Tina Brennan MRN: [id number] Date of Birth: [birth year] PCP: [facility]   Tina Brennan 45 y.o. female presents for a telephone visit for medication management today. Verified patient with two identifiers.   There were no vitals taken for this visit.  Patient Information   Past Medical History:  Diagnosis Date  . Diabetes mellitus without complication (HCC)   . Hypertension   . IBS (irritable bowel syndrome)   . Kidney stones   . Pancreatitis       Past Surgical History:  Procedure Laterality Date  . ABDOMINAL HYSTERECTOMY    . BLADDER SURGERY    . cyst removal of ovaries      History reviewed. No pertinent family history.  New Diagnoses (since last visit): N/A  Family Support: Good  Lifestyle Diet: Breakfast: skip - not hungry; maybe a fruit cup Lunch: salad, fresh vegetables, salmon Dinner: protein and vegetables  Drinks: water   *has stopped eating fast food since mom living with her   Current Exercise Habits: The patient has a physically strenuous job, but has no regular exercise apart from work.       Social History   Substance and Sexual Activity  Alcohol Use No      Social History   Tobacco Use  Smoking Status Never Smoker  Smokeless Tobacco Never Used      Health Maintenance  Topic Date Due  . Hepatitis C Screening  Never done  . COVID-19 Vaccine (1) Never done  . HIV Screening  Never done  . TETANUS/TDAP  Never done  . PAP SMEAR-Modifier  Never done  . COLONOSCOPY (Pts 45-29yrs Insurance coverage will need to be confirmed)  Never done  . INFLUENZA VACCINE  Never done   Health Maintenance/Date Completed  Last ED visit: 06/25/2020 Last Visit to PCP: 06/2020 Next Visit to PCP: may be at the end of Jan 2022 or beginning of Feb 2022 Specialist Visit: N/A Dental Exam: 3 years ago Eye Exam: unknown Pelvic/PAP Exam: hysterectomy Mammogram: gave pt [facility] contact  and informed her to request for referral if needed from her PCP Colonoscopy: 2008 Flu Vaccine: 2021 Pneumonia Vaccine: a few years ago COVID-19 Vaccine: primary series completed; planning to get booster  Outpatient Encounter Medications as of 06/29/2020  Medication Sig  . Cinnamon 500 MG TABS Take 500 mg by mouth daily.  . cloNIDine (CATAPRES) 0.1 MG tablet Take 2 tablets (0.2 mg total) by mouth 3 (three) times daily. (Patient taking differently: Take 0.1 mg by mouth daily as needed.)  . dicyclomine (BENTYL) 20 MG tablet Take 1 tablet (20 mg total) by mouth 4 (four) times daily -  before meals and at bedtime.  Marland Kitchen ibuprofen (ADVIL) 200 MG tablet Take 200 mg by mouth every 6 (six) hours as needed.  Marland Kitchen lisinopril (ZESTRIL) 40 MG tablet Take 1 tablet (40 mg total) by mouth daily.  . metFORMIN (GLUCOPHAGE) 500 MG tablet Take 1 tablet (500 mg total) by mouth 2 (two) times daily with a meal.  . metoprolol succinate (TOPROL-XL) 100 MG 24 hr tablet TAKE 1 TABLET BY MOUTH ONCE DAILY TAKE  WITH  OR  IMMEDIATELY  FOLLOWING  A  MEAL  . omeprazole (PRILOSEC) 20 MG capsule Take 1 capsule (20 mg total) by mouth daily.  . promethazine (PHENERGAN) 25 MG tablet Take 0.5 tablets (12.5 mg total) by mouth every 6 (six) hours as needed for nausea or vomiting. (Patient not taking: Reported on 06/29/2020)  . [  DISCONTINUED] predniSONE (STERAPRED UNI-PAK 21 TAB) 10 MG (21) TBPK tablet Take 6 tablets the first day, take 5 tablets the second day, take 4 tablets the third day, take 3 tablets the fourth day, take 2 tablets the fifth day, take 1 tablet the sixth day.   No facility-administered encounter medications on file as of 06/29/2020.     Assessment and Plan: Diabetes Pt takes metformin and states that she checks her BG four times per day and reports values ranging from 98-140. She states that she no longer eats fastfood and has been eating what her mom cooks which is typically a protein (mostly salmon) and vegetables.  She also reports that she dropped drinking soda and drinks mostly water now. Patient denies any symptoms or episodes of hypoglycemia. She states that she has an upcoming appt where they will check her A1c. Pt does not routinely exercise, however she has a strenuous job. Encouraged pt to continue her current eating habits and regimen. Anticipate A1c to improve based on her lifestyle modifications and medication adherence.   HTN Pt takes lisinopril, metoprolol succinate and PRN clonidine for her BP. She checks her BP twice daily and states the numbers are good but could not provide any values. Pt denies any symptoms or episodes of hypotension. Continue current regimen.   IBS Pt takes dicyclomine and does not have any complaints at this time.   GERD Pt takes omeprazole and does not have any complaints at this time.   Access/Adherence Pt remembers to take her medications by keeping them out on her bed stand so that she sees them every morning. Pt did not need any refills at this time.   Pt inquired about mammogram. Provided contact information and informed her that if referral is needed she can get it at her upcoming appt.     Raiford Noble, PharmD Pharmacy Resident  06/29/2020 11:17 AM

## 2020-07-23 ENCOUNTER — Other Ambulatory Visit: Payer: Self-pay | Admitting: Family Medicine

## 2020-08-21 ENCOUNTER — Telehealth: Payer: Self-pay | Admitting: Pharmacy Technician

## 2020-08-21 NOTE — Telephone Encounter (Signed)
Patient failed to provide 2022 proof of income.  No additional medication assistance will be provided by [facility] without the required proof of income documentation.  Patient notified by letter.  [name] Care Manager [facility]   [name] [address] [place]  [address]    [name], 2022    [name] [address] [place], [place]  [address]  [name]:  This is to inform you that you are no longer eligible to receive medication assistance at [facility].  The reason(s) are:    _____Your total gross monthly household income exceeds 250% of the Federal Poverty Level.   _____Tangible assets (savings, checking, stocks/bonds, pension, retirement, etc.) exceeds our limit  _____You are eligible to receive benefits from [facility], [facility] or HIV Medication              Assistance Program _____You are eligible to receive benefits from a Medicare Part "D" plan _____You have prescription insurance  _____You are not an [facility] resident __X__Failure to provide all requested proof of income information for 2022.    Medication assistance will resume once all requested financial information has been returned to our clinic.  If you have questions, please contact our clinic at [phone].    Thank you,  [facility]

## 2020-09-11 ENCOUNTER — Other Ambulatory Visit: Payer: Self-pay

## 2020-09-11 MED FILL — Dicyclomine HCl Tab 20 MG: ORAL | 30 days supply | Qty: 120 | Fill #0 | Status: AC

## 2020-09-11 MED FILL — Bupropion HCl Tab ER 24HR 150 MG: ORAL | 30 days supply | Qty: 30 | Fill #0 | Status: AC

## 2020-09-11 MED FILL — Atorvastatin Calcium Tab 10 MG (Base Equivalent): ORAL | 90 days supply | Qty: 180 | Fill #0 | Status: AC

## 2020-09-11 MED FILL — Gabapentin Tab 600 MG: ORAL | 30 days supply | Qty: 90 | Fill #0 | Status: AC

## 2020-09-11 MED FILL — Omeprazole Cap Delayed Release 20 MG: ORAL | 90 days supply | Qty: 90 | Fill #0 | Status: AC

## 2020-09-11 MED FILL — Metoprolol Tartrate Tab 50 MG: ORAL | 90 days supply | Qty: 180 | Fill #0 | Status: AC

## 2020-09-12 ENCOUNTER — Other Ambulatory Visit: Payer: Self-pay

## 2020-09-13 ENCOUNTER — Other Ambulatory Visit: Payer: Self-pay

## 2020-10-05 ENCOUNTER — Emergency Department: Payer: Self-pay

## 2020-10-05 ENCOUNTER — Emergency Department
Admission: EM | Admit: 2020-10-05 | Discharge: 2020-10-05 | Disposition: A | Discharge status: Home / Self Care | Payer: Self-pay | Attending: Emergency Medicine | Admitting: Emergency Medicine

## 2020-10-05 ENCOUNTER — Other Ambulatory Visit: Payer: Self-pay

## 2020-10-05 ENCOUNTER — Encounter: Payer: Self-pay | Admitting: Emergency Medicine

## 2020-10-05 DIAGNOSIS — R1010 Upper abdominal pain, unspecified: Secondary | ICD-10-CM

## 2020-10-05 DIAGNOSIS — I10 Essential (primary) hypertension: Secondary | ICD-10-CM | POA: Insufficient documentation

## 2020-10-05 DIAGNOSIS — Z7984 Long term (current) use of oral hypoglycemic drugs: Secondary | ICD-10-CM | POA: Insufficient documentation

## 2020-10-05 DIAGNOSIS — Z8616 Personal history of COVID-19: Secondary | ICD-10-CM | POA: Insufficient documentation

## 2020-10-05 DIAGNOSIS — E119 Type 2 diabetes mellitus without complications: Secondary | ICD-10-CM | POA: Insufficient documentation

## 2020-10-05 DIAGNOSIS — K5000 Crohn's disease of small intestine without complications: Secondary | ICD-10-CM | POA: Insufficient documentation

## 2020-10-05 DIAGNOSIS — Z79899 Other long term (current) drug therapy: Secondary | ICD-10-CM | POA: Insufficient documentation

## 2020-10-05 LAB — URINALYSIS, COMPLETE (UACMP) WITH MICROSCOPIC
Bacteria, UA: NONE SEEN
Bilirubin Urine: NEGATIVE
Glucose, UA: NEGATIVE mg/dL
Hgb urine dipstick: NEGATIVE
Ketones, ur: NEGATIVE mg/dL
Nitrite: NEGATIVE
Protein, ur: NEGATIVE mg/dL
Specific Gravity, Urine: 1.009 (ref 1.005–1.030)
pH: 7 (ref 5.0–8.0)

## 2020-10-05 LAB — COMPREHENSIVE METABOLIC PANEL
ALT: 30 U/L (ref 0–44)
AST: 32 U/L (ref 15–41)
Albumin: 4.5 g/dL (ref 3.5–5.0)
Alkaline Phosphatase: 90 U/L (ref 38–126)
Anion gap: 10 (ref 5–15)
BUN: 9 mg/dL (ref 6–20)
CO2: 29 mmol/L (ref 22–32)
Calcium: 9.8 mg/dL (ref 8.9–10.3)
Chloride: 100 mmol/L (ref 98–111)
Creatinine, Ser: 0.78 mg/dL (ref 0.44–1.00)
GFR, Estimated: >60 mL/min (ref >60)
Glucose, Bld: 165 mg/dL — ABNORMAL HIGH (ref 70–99)
Potassium: 3.7 mmol/L (ref 3.5–5.1)
Sodium: 139 mmol/L (ref 135–145)
Total Bilirubin: 0.7 mg/dL (ref 0.3–1.2)
Total Protein: 8.3 g/dL — ABNORMAL HIGH (ref 6.5–8.1)

## 2020-10-05 LAB — CBC
HCT: 44.8 % (ref 36.0–46.0)
Hemoglobin: 16.1 g/dL — ABNORMAL HIGH (ref 12.0–15.0)
MCH: 31.9 pg (ref 26.0–34.0)
MCHC: 35.9 g/dL (ref 30.0–36.0)
MCV: 88.9 fL (ref 80.0–100.0)
Platelets: 235 10*3/uL (ref 150–400)
RBC: 5.04 MIL/uL (ref 3.87–5.11)
RDW: 12.5 % (ref 11.5–15.5)
WBC: 9.5 10*3/uL (ref 4.0–10.5)
nRBC: 0 % (ref 0.0–0.2)

## 2020-10-05 LAB — POC URINE PREG, ED: Preg Test, Ur: NEGATIVE

## 2020-10-05 LAB — LIPASE, BLOOD: Lipase: 50 U/L (ref 11–51)

## 2020-10-05 MED ORDER — METRONIDAZOLE 500 MG PO TABS
500.0000 mg | ORAL_TABLET | Freq: Once | ORAL | Status: AC
  Administered 2020-10-05: 500 mg via ORAL
  Filled 2020-10-05: qty 1

## 2020-10-05 MED ORDER — METRONIDAZOLE 500 MG PO TABS
500.0000 mg | ORAL_TABLET | Freq: Three times a day (TID) | ORAL | 0 refills | Status: AC
  Filled 2020-10-05: qty 30, 10d supply, fill #0

## 2020-10-05 MED ORDER — MORPHINE SULFATE (PF) 4 MG/ML IV SOLN
4.0000 mg | Freq: Once | INTRAVENOUS | Status: AC
Start: 2020-10-05 — End: 2020-10-05
  Administered 2020-10-05: 4 mg via INTRAVENOUS
  Filled 2020-10-05: qty 1

## 2020-10-05 MED ORDER — CIPROFLOXACIN HCL 500 MG PO TABS
500.0000 mg | ORAL_TABLET | Freq: Once | ORAL | Status: AC
  Administered 2020-10-05: 500 mg via ORAL
  Filled 2020-10-05: qty 1

## 2020-10-05 MED ORDER — CIPROFLOXACIN HCL 500 MG PO TABS: 500.0000 mg | ORAL_TABLET | Freq: Two times a day (BID) | ORAL | 0 refills | Status: AC

## 2020-10-05 MED ORDER — PROMETHAZINE HCL 25 MG PO TABS
25.0000 mg | ORAL_TABLET | Freq: Four times a day (QID) | ORAL | 0 refills | Status: DC | PRN
  Filled 2020-10-05: qty 15, 4d supply, fill #0

## 2020-10-05 MED ORDER — PROMETHAZINE HCL 25 MG PO TABS: 25.0000 mg | ORAL_TABLET | Freq: Four times a day (QID) | ORAL | 0 refills | Status: DC | PRN

## 2020-10-05 MED ORDER — MORPHINE SULFATE (PF) 4 MG/ML IV SOLN
4.0000 mg | Freq: Once | INTRAVENOUS | Status: AC
  Administered 2020-10-05: 4 mg via INTRAVENOUS
  Filled 2020-10-05: qty 1

## 2020-10-05 MED ORDER — ALUM & MAG HYDROXIDE-SIMETH 200-200-20 MG/5ML PO SUSP
30.0000 mL | Freq: Once | ORAL | Status: AC
  Administered 2020-10-05: 30 mL via ORAL
  Filled 2020-10-05: qty 30

## 2020-10-05 MED ORDER — PROBIOTIC 1-250 BILLION-MG PO CAPS: 1.0000 | ORAL_CAPSULE | Freq: Every day | ORAL | 0 refills | Status: DC

## 2020-10-05 MED ORDER — SODIUM CHLORIDE 0.9 % IV BOLUS (SEPSIS)
1000.0000 mL | Freq: Once | INTRAVENOUS | Status: AC
  Administered 2020-10-05: 1000 mL via INTRAVENOUS

## 2020-10-05 MED ORDER — PANTOPRAZOLE SODIUM 40 MG IV SOLR
40.0000 mg | Freq: Once | INTRAVENOUS | Status: AC
  Administered 2020-10-05: 40 mg via INTRAVENOUS
  Filled 2020-10-05: qty 40

## 2020-10-05 MED ORDER — SODIUM CHLORIDE 0.9 % IV SOLN
12.5000 mg | Freq: Four times a day (QID) | INTRAVENOUS | Status: DC | PRN
  Administered 2020-10-05: 12.5 mg via INTRAVENOUS
  Filled 2020-10-05: qty 0.5

## 2020-10-05 MED ORDER — CIPROFLOXACIN HCL 500 MG PO TABS
500.0000 mg | ORAL_TABLET | Freq: Two times a day (BID) | ORAL | 0 refills | Status: DC
  Filled 2020-10-05: qty 20, 10d supply, fill #0

## 2020-10-05 MED ORDER — LIDOCAINE VISCOUS HCL 2 % MT SOLN
15.0000 mL | Freq: Once | OROMUCOSAL | Status: AC
  Administered 2020-10-05: 15 mL via ORAL
  Filled 2020-10-05: qty 15

## 2020-10-05 MED ORDER — IOHEXOL 300 MG/ML  SOLN
100.0000 mL | Freq: Once | INTRAMUSCULAR | Status: AC | PRN
  Administered 2020-10-05: 100 mL via INTRAVENOUS

## 2020-10-05 MED ORDER — METRONIDAZOLE 500 MG PO TABS: 500.0000 mg | ORAL_TABLET | Freq: Three times a day (TID) | ORAL | 0 refills | Status: AC

## 2020-10-05 MED ORDER — OXYCODONE-ACETAMINOPHEN 5-325 MG PO TABS: 2.0000 | ORAL_TABLET | Freq: Four times a day (QID) | ORAL | 0 refills | Status: DC | PRN

## 2020-10-05 NOTE — ED Notes (Signed)
Late entry-- Pt returned from CT dept via stretcher- no acute changes

## 2020-10-05 NOTE — Discharge Instructions (Addendum)
You were found to have inflammation of your ileum which is part of your small intestine.  This is likely due to to infection and why we have put you on antibiotics for the next 10 days.  Please take these antibiotics until complete.  I recommend taking a probiotic while on antibiotics.  There is also possibility that this could be due to inflammation and other underlying medical conditions such as inflammatory bowel disease like Crohn's disease or ulcerative colitis.  I recommend close follow-up with gastroenterology as an outpatient after antibiotics are complete as you may need a repeat colonoscopy.  If you have worsening abdominal pain, fevers despite taking antibiotics, vomiting that does not stop, blood in your stool or black and tarry stools, please return to the emergency department.

## 2020-10-05 NOTE — ED Notes (Signed)
Pt provided two 8 oz cups water per PO fluid challenge

## 2020-10-05 NOTE — ED Notes (Signed)
Pt agreeable with d/c plan as discussed by Dr Elesa Massed- this nurse has verbally reinforced d/c instructions and provided pt with written copy- pt acknowledges verbal understanding and denies any additional questions, concerns, needs - escorted to waiting room via w/c to await ride (charge nurse and first nurse made aware of pt location/status)

## 2020-10-05 NOTE — ED Triage Notes (Signed)
Pt arrived via POV with reports of abd pain for 5-6 days.  Pt states she has been taking colace and mild laxatives states she is moving her bowels but states she continues to have epigastric pain along with N/V. Pt has had multiple surgeries to stomach.

## 2020-10-05 NOTE — ED Notes (Signed)
Pt to discharge home this a.m. -- pt has reported pain is starting to become worse since last dose of IVP morphine -- per Dr Elesa Massed pt to have another dose of IVP Morphine (pt reports her mother will be driving her home)

## 2020-10-05 NOTE — ED Notes (Signed)
Pt awake alert and oriented x4.  RR even and unlabored on RA with symmetrical rise and fall of chest.  Pt placed on continuous cardiac and pulse ox monitoring -- BP remains elevated (198/105 with current vital sign reading).  Pt reports ongoing abdominal pain - localized to epigastric region with associated chills, diarrhea and nausea.  Abdomen soft but tender.  Skin warm dry and intact.  4mg  IVP Morphine administered for pain - phernegan for nausea and 1L NS bolus for hydration (see MAR) -- pt now awaits CT dept for pending CT abd/pelvis- no other needs, questions, concerns verbalized at this time.

## 2020-10-05 NOTE — ED Notes (Signed)
Pt off the floor to CT dept via stretcher  

## 2020-10-05 NOTE — ED Notes (Addendum)
Pt ambulatory from waiting room escorted triage staff; bp elevated upon arrival- gait steady; no acute distress.

## 2020-10-05 NOTE — ED Provider Notes (Signed)
[facility] Emergency Department Provider Note  ____________________________________________   Event Date/Time   First MD Initiated Contact with Patient 10/05/20 0406     (approximate)  I have reviewed the triage vital signs and the nursing notes.   HISTORY  Chief Complaint Abdominal Pain    HPI Tina Brennan is a 45 y.o. female with history of hypertension, diabetes, pancreatitis, kidney stones, irritable bowel syndrome who presents to the emergency department with complaints of diffuse upper abdominal pain, nausea and vomiting.  No radiation of pain.  She reports for several days she has had abdominal pain and has felt "bound up".  She has tried Colace and laxatives and has had multiple bowel movements without any improvement in her symptoms.  States that she has had decreased appetite and nausea and vomiting tonight.  She states she is having heartburn as well and feels "everything on fire".  She states that some of this feels similar to her pancreatitis which was many years ago.  She states she has had multiple abdominal surgeries including ovarian cystectomy, BTL, hysterectomy, bladder tack surgery, lysis of adhesions, appendectomy.  Denies previous history of bowel obstruction.  No dysuria, hematuria, vaginal bleeding or discharge.  Denies fever.  No diarrhea.        Past Medical History:  Diagnosis Date  . COVID-19 06/2020  . Diabetes mellitus without complication (HCC)   . Hypertension   . IBS (irritable bowel syndrome)   . Kidney stones   . Pancreatitis     Patient Active Problem List   Diagnosis Date Noted  . Hypertension 10/07/2018  . Migraine with status migrainosus 10/07/2018  . IBS (irritable bowel syndrome) 09/23/2018  . Headache 09/23/2018  . Blurred vision, bilateral 09/23/2018  . Healthcare maintenance 09/23/2018  . BMI 33.0-33.9,adult 09/23/2018  . Abdominal pain 02/16/2013  . Diarrhea 02/16/2013  . Nausea and vomiting  02/16/2013    Past Surgical History:  Procedure Laterality Date  . ABDOMINAL HYSTERECTOMY    . BLADDER SURGERY    . cyst removal of ovaries      Prior to Admission medications   Medication Sig Start Date End Date Taking? Authorizing Provider  Bacillus Coagulans-Inulin (PROBIOTIC) 1-250 BILLION-MG CAPS Take 1 capsule by mouth daily. 10/05/20  Yes Paetyn Pietrzak, Layla Maw, DO  ciprofloxacin (CIPRO) 500 MG tablet Take 1 tablet (500 mg total) by mouth 2 (two) times daily for 10 days. 10/05/20 10/15/20 Yes Nafeesah Lapaglia, Layla Maw, DO  metroNIDAZOLE (FLAGYL) 500 MG tablet Take 1 tablet (500 mg total) by mouth 3 (three) times daily for 10 days. 10/05/20 10/15/20 Yes Analis Distler, Layla Maw, DO  oxyCODONE-acetaminophen (PERCOCET) 5-325 MG tablet Take 2 tablets by mouth every 6 (six) hours as needed for severe pain. 10/05/20 10/05/21 Yes Lamari Beckles, Layla Maw, DO  promethazine (PHENERGAN) 25 MG tablet Take 1 tablet (25 mg total) by mouth every 6 (six) hours as needed for nausea or vomiting. 10/05/20  Yes Adora Yeh, Layla Maw, DO  atorvastatin (LIPITOR) 10 MG tablet TAKE 2 TABLETS (20 MG) BY MOUTH AT BEDTIME FOR HIGH CHOLESTEROL 05/07/20 03/01/21  Shane Crutch, PA  buPROPion (WELLBUTRIN XL) 150 MG 24 hr tablet TAKE ONE TABLET BY MOUTH EVERY DAY 07/23/20 07/23/21  Shane Crutch, PA  Cinnamon 500 MG TABS Take 500 mg by mouth daily.    [provider]  cloNIDine (CATAPRES) 0.1 MG tablet Take 2 tablets (0.2 mg total) by mouth 3 (three) times daily. Patient taking differently: Take 0.1 mg by mouth daily as needed. 10/07/18  10/07/19  McGowan, Carollee HerterShannon A, PA-C  dicyclomine (BENTYL) 20 MG tablet Take 1 tablet (20 mg total) by mouth 4 (four) times daily -  before meals and at bedtime. 11/27/19   Minna AntisPaduchowski, Kevin, MD  dicyclomine (BENTYL) 20 MG tablet TAKE 1 TABLET BY MOUTH FOUR TIMES A DAY 06/29/20 06/29/21  Shane CrutchMarkley, Linda, PA  gabapentin (NEURONTIN) 100 MG capsule TAKE ONE CAPSULE BY MOUTH AT BEDTIME WITH THE 300 MG 06/06/20 06/06/21  Shane CrutchMarkley, Linda, PA   gabapentin (NEURONTIN) 300 MG capsule TAKE ONE CAPSULE BY MOUTH THREE TIMES DAY 06/06/20 06/06/21  Shane CrutchMarkley, Linda, PA  gabapentin (NEURONTIN) 300 MG capsule TAKE ONE CAPSULE BY MOUTH EVERY MORNING, 1 CAPSULE MID AFTERNOON AND 1 AT NIGHT 02/27/20 02/26/21  Shane CrutchMarkley, Linda, PA  gabapentin (NEURONTIN) 600 MG tablet TAKE ONE TABLET BY MOUTH 3 TIMES A DAY 06/06/20 06/06/21  Shane CrutchMarkley, Linda, PA  ibuprofen (ADVIL) 200 MG tablet Take 200 mg by mouth every 6 (six) hours as needed.    [provider]  lisinopril (ZESTRIL) 40 MG tablet Take 1 tablet (40 mg total) by mouth daily. 11/27/19   Minna AntisPaduchowski, Kevin, MD  lisinopril (ZESTRIL) 40 MG tablet TAKE ONE TABLET BY MOUTH EVERY DAY FOR HIGH BLOOD PRESSURE 07/23/20 07/23/21  Shane CrutchMarkley, Linda, PA  lisinopril (ZESTRIL) 40 MG tablet TAKE ONE TABLET BY MOUTH EVERY DAY FOR HIGH BLOOD PRESSURE 04/16/20 04/16/21  Shane CrutchMarkley, Linda, PA  metFORMIN (GLUCOPHAGE) 500 MG tablet Take 1 tablet (500 mg total) by mouth 2 (two) times daily with a meal. 04/08/19   Bast, Traci A, NP  metFORMIN (GLUCOPHAGE) 500 MG tablet TAKE ONE TABLET BY MOUTH 2 TIMES A DAY FOR DIABETES. 07/23/20 07/23/21  Shane CrutchMarkley, Linda, PA  metoprolol succinate (TOPROL-XL) 100 MG 24 hr tablet TAKE 1 TABLET BY MOUTH ONCE DAILY TAKE  WITH  OR  IMMEDIATELY  FOLLOWING  A  MEAL 04/08/19   Bast, Traci A, NP  metoprolol tartrate (LOPRESSOR) 50 MG tablet TAKE ONE TABLET BY MOUTH 2 TIMES A DAY 06/06/20 06/06/21  Shane CrutchMarkley, Linda, PA  omeprazole (PRILOSEC) 20 MG capsule Take 1 capsule (20 mg total) by mouth daily. 09/30/18   Andreas Ohmukov-Yual, Magdalene S, NP  omeprazole (PRILOSEC) 20 MG capsule TAKE ONE CAPSULE BY MOUTH EVERY DAY FOR REFLUX 07/23/20 01/30/21  Shane CrutchMarkley, Linda, PA  omeprazole (PRILOSEC) 20 MG capsule TAKE ONE CAPSULE BY MOUTH EVERY DAY FOR REFLUX 04/16/20 01/30/21  Shane CrutchMarkley, Linda, PA    Allergies Penicillins, Demerol [meperidine], Reglan [metoclopramide], Toradol [ketorolac tromethamine], Zofran [ondansetron hcl], and Compazine  [prochlorperazine edisylate]  No family history on file.  Social History Social History   Tobacco Use  . Smoking status: Never Smoker  . Smokeless tobacco: Never Used  Vaping Use  . Vaping Use: Never used  Substance Use Topics  . Alcohol use: No  . Drug use: Not Currently    Types: Marijuana    Comment: occ    Review of Systems Constitutional: No fever. Eyes: No visual changes. ENT: No sore throat. Cardiovascular: Denies chest pain. Respiratory: Denies shortness of breath. Gastrointestinal: + Nausea and vomiting Genitourinary: Negative for dysuria. Musculoskeletal: Negative for back pain. Skin: Negative for rash. Neurological: Negative for focal weakness or numbness.  ____________________________________________   PHYSICAL EXAM:  VITAL SIGNS: ED Triage Vitals [10/05/20 0328]  Enc Vitals Group     BP (!) 184/117     Pulse Rate 80     Resp 20     Temp 98 F (36.7 C)     Temp Source Oral  SpO2 98 %     Weight 215 lb (97.5 kg)     Height 5\' 4"  (1.626 m)     Head Circumference      Peak Flow      Pain Score 6     Pain Loc      Pain Edu?      Excl. in GC?    CONSTITUTIONAL: Alert and oriented and responds appropriately to questions. Well-appearing; well-nourished HEAD: Normocephalic EYES: Conjunctivae clear, pupils appear equal, EOM appear intact ENT: normal nose; moist mucous membranes NECK: Supple, normal ROM CARD: RRR; S1 and S2 appreciated; no murmurs, no clicks, no rubs, no gallops RESP: Normal chest excursion without splinting or tachypnea; breath sounds clear and equal bilaterally; no wheezes, no rhonchi, no rales, no hypoxia or respiratory distress, speaking full sentences ABD/GI: Normal bowel sounds; non-distended; soft, diffusely tender throughout the upper abdomen without guarding or rebound BACK: The back appears normal EXT: Normal ROM in all joints; no deformity noted, no edema; no cyanosis SKIN: Normal color for age and race; warm; no rash  on exposed skin NEURO: Moves all extremities equally PSYCH: The patient's mood and manner are appropriate.  ____________________________________________   LABS (all labs ordered are listed, but only abnormal results are displayed)  Labs Reviewed  COMPREHENSIVE METABOLIC PANEL - Abnormal; Notable for the following components:      Result Value   Glucose, Bld 165 (*)    Total Protein 8.3 (*)    All other components within normal limits  CBC - Abnormal; Notable for the following components:   Hemoglobin 16.1 (*)    All other components within normal limits  URINALYSIS, COMPLETE (UACMP) WITH MICROSCOPIC - Abnormal; Notable for the following components:   Color, Urine YELLOW (*)    APPearance CLEAR (*)    Leukocytes,Ua TRACE (*)    All other components within normal limits  LIPASE, BLOOD  POC URINE PREG, ED   ____________________________________________  EKG   EKG Interpretation  Date/Time:  Friday Oct 05 2020 03:40:26 EDT Ventricular Rate:  85 PR Interval:  184 QRS Duration: 86 QT Interval:  370 QTC Calculation: 440 R Axis:   27 Text Interpretation: Normal sinus rhythm Cannot rule out Anterior infarct , age undetermined Abnormal ECG Confirmed by 09-15-2001 [phone]) on 10/05/2020 5:05:57 AM       ____________________________________________  RADIOLOGY 12/05/2020 Parke Jandreau, personally viewed and evaluated these images (plain radiographs) as part of my medical decision making, as well as reviewing the written report by the radiologist.  ED MD interpretation:  Terminal ileitis.  No bowel obstruction.  Official radiology report(s): CT ABDOMEN PELVIS W CONTRAST  Result Date: 10/05/2020 CLINICAL DATA:  Epigastric pain with nausea, vomiting and diarrhea for 4 days EXAM: CT ABDOMEN AND PELVIS WITH CONTRAST TECHNIQUE: Multidetector CT imaging of the abdomen and pelvis was performed using the standard protocol following bolus administration of intravenous contrast. CONTRAST:  12/05/2020  OMNIPAQUE IOHEXOL 300 MG/ML  SOLN COMPARISON:  CT 02/28/2019 FINDINGS: Lower chest: Bandlike scar in the lingula and right lower lobe is unchanged from comparisons. Lung bases are clear. Normal heart size. No pericardial effusion. Hepatobiliary: Diffuse hepatic hypoattenuation compatible with hepatic steatosis. Sparing along the gallbladder fossa. No concerning focal liver lesion.Smooth liver surface contour. Normal gallbladder and biliary tree without visible calcified gallstone. Pancreas: No pancreatic ductal dilatation or surrounding inflammatory changes. Spleen: Normal in size. No concerning splenic lesions. Adrenals/Urinary Tract: Normal adrenal glands. Kidneys are normally located with symmetric enhancement and excretion. No  suspicious renal lesion, urolithiasis or hydronephrosis. Urinary bladder is unremarkable for the degree of distention. Stomach/Bowel: Distal esophagus, stomach and duodenum are free of significant abnormality. Proximal small bowel is unremarkable. There is some questionable hyperemic thickening at the terminal ileum. Appendix is not visualized. Moderate colonic stool burden. Vascular/Lymphatic: No significant vascular findings are present. No pathologically enlarged abdominopelvic lymph nodes. Reproductive: Uterus is surgically absent. No concerning adnexal lesions. No concerning adnexal lesions. Surgical changes along the pelvic sidewalls. Other: No abdominopelvic free fluid or free gas. No bowel containing hernias. Musculoskeletal: Multilevel degenerative changes are present in the imaged portions of the spine. No acute osseous abnormality or suspicious osseous lesion. IMPRESSION: Question some mild hyperemic thickening towards the terminal ileum, could reflect an acute infectious or inflammatory ileitis. No other acute abdominopelvic abnormality is seen to provide cause for patient's symptoms. Hepatic steatosis Prior hysterectomy. Electronically Signed   By: Kreg Shropshire M.D.   On:  10/05/2020 06:06    ____________________________________________   PROCEDURES  Procedure(s) performed (including Critical Care):  Procedures    ____________________________________________   INITIAL IMPRESSION / ASSESSMENT AND PLAN / ED COURSE  As part of my medical decision making, I reviewed the following data within the electronic MEDICAL RECORD NUMBER Nursing notes reviewed and incorporated, Labs reviewed , Old chart reviewed, Radiograph reviewed , Notes from prior ED visits and [place] Controlled Substance Database         Patient here with complaints of upper abdominal pain, nausea and vomiting.  Differential includes cholelithiasis, cholecystitis, pancreatitis, choledocholithiasis, cholangitis, bowel obstruction, colitis, gastritis, GERD.  Will obtain labs, urine, CT of the abdomen pelvis.  Will give IV fluids, pain and nausea medicine.  Will give Protonix, GI cocktail for symptoms of acid reflux.  ED PROGRESS  Patient reports feeling better.  Able to tolerate p.o.  CT scan concerning for infectious versus inflammatory terminal ileitis.  She has history of IBS and states she has had a colonoscopy years ago but denies any history of IBD or family history of IBD.  I recommended that she follow-up with GI as an outpatient after finishing a course of antibiotics.  Will discharge with Cipro, Flagyl as she is allergic to penicillins.  Will discharge with pain and nausea medicine.  Patient comfortable with this plan.   At this time, I do not feel there is any life-threatening condition present. I have reviewed, interpreted and discussed all results (EKG, imaging, lab, urine as appropriate) and exam findings with patient/family. I have reviewed nursing notes and appropriate previous records.  I feel the patient is safe to be discharged home without further emergent workup and can continue workup as an outpatient as needed. Discussed usual and customary return precautions. Patient/family  verbalize understanding and are comfortable with this plan.  Outpatient follow-up has been provided as needed. All questions have been answered.  ____________________________________________   FINAL CLINICAL IMPRESSION(S) / ED DIAGNOSES  Final diagnoses:  Upper abdominal pain  Terminal ileitis without complication [facility])     ED Discharge Orders         Ordered    ciprofloxacin (CIPRO) 500 MG tablet  2 times daily        10/05/20 0655    metroNIDAZOLE (FLAGYL) 500 MG tablet  3 times daily        10/05/20 0655    oxyCODONE-acetaminophen (PERCOCET) 5-325 MG tablet  Every 6 hours PRN        10/05/20 0655    promethazine (PHENERGAN) 25 MG tablet  Every  6 hours PRN        10/05/20 0655    Bacillus Coagulans-Inulin (PROBIOTIC) 1-250 BILLION-MG CAPS  Daily        10/05/20 0655          *Please note:  Ronia Hazelett was evaluated in Emergency Department on 10/05/2020 for the symptoms described in the history of present illness. She was evaluated in the context of the global COVID-19 pandemic, which necessitated consideration that the patient might be at risk for infection with the SARS-CoV-2 virus that causes COVID-19. Institutional protocols and algorithms that pertain to the evaluation of patients at risk for COVID-19 are in a state of rapid change based on information released by regulatory bodies including the CDC and federal and state organizations. These policies and algorithms were followed during the patient's care in the ED.  Some ED evaluations and interventions may be delayed as a result of limited staffing during and the pandemic.*   Note:  This document was prepared using Dragon voice recognition software and may include unintentional dictation errors.   Ellysa Parrack, Layla Maw, DO 10/05/20 [phone]

## 2020-10-05 NOTE — ED Notes (Signed)
ED provider at bedside discussing CT results and plan for discharge

## 2020-10-09 ENCOUNTER — Other Ambulatory Visit: Payer: Self-pay

## 2020-10-09 MED FILL — Dicyclomine HCl Tab 20 MG: ORAL | 20 days supply | Qty: 80 | Fill #1 | Status: AC

## 2020-10-17 ENCOUNTER — Other Ambulatory Visit: Payer: Self-pay

## 2020-10-17 MED FILL — Lisinopril Tab 40 MG: ORAL | 30 days supply | Qty: 30 | Fill #0 | Status: AC

## 2020-10-17 MED FILL — Metformin HCl Tab 500 MG: ORAL | 30 days supply | Qty: 60 | Fill #0 | Status: AC

## 2020-10-30 ENCOUNTER — Other Ambulatory Visit: Payer: Self-pay

## 2020-10-30 MED ORDER — DICYCLOMINE HCL 20 MG PO TABS
ORAL_TABLET | Freq: Four times a day (QID) | ORAL | 3 refills | Status: DC
  Filled 2020-10-30: qty 80, fill #0

## 2020-10-30 MED ORDER — DICYCLOMINE HCL 20 MG PO TABS
20.0000 mg | ORAL_TABLET | Freq: Four times a day (QID) | ORAL | 3 refills | Status: DC
  Filled 2020-10-30: qty 120, 30d supply, fill #0
  Filled 2020-12-11: qty 120, 30d supply, fill #1
  Filled 2020-12-11: qty 12, 3d supply, fill #1
  Filled 2020-12-13: qty 120, 30d supply, fill #2
  Filled 2021-01-07: qty 120, 30d supply, fill #3
  Filled 2021-01-31: qty 108, 27d supply, fill #4

## 2020-11-16 ENCOUNTER — Other Ambulatory Visit: Payer: Self-pay

## 2020-11-16 MED ORDER — GABAPENTIN 600 MG PO TABS
ORAL_TABLET | Freq: Three times a day (TID) | ORAL | 0 refills | Status: DC
  Filled 2020-11-16: qty 90, 30d supply, fill #0
  Filled 2021-01-31: qty 90, 30d supply, fill #1
  Filled 2021-06-04: qty 90, 30d supply, fill #2

## 2020-11-16 MED FILL — Metformin HCl Tab 500 MG: ORAL | 30 days supply | Qty: 60 | Fill #1 | Status: AC

## 2020-11-16 MED FILL — Lisinopril Tab 40 MG: ORAL | 30 days supply | Qty: 30 | Fill #1 | Status: AC

## 2020-11-16 MED FILL — Bupropion HCl Tab ER 24HR 150 MG: ORAL | 30 days supply | Qty: 30 | Fill #1 | Status: AC

## 2020-11-19 ENCOUNTER — Other Ambulatory Visit: Payer: Self-pay

## 2020-11-20 ENCOUNTER — Telehealth: Payer: Self-pay | Admitting: Pharmacy Technician

## 2020-11-20 NOTE — Telephone Encounter (Signed)
Received updated proof of income.  Patient eligible to receive medication assistance at [facility] until time for re-certification in 2023, and as long as eligibility requirements continue to be met.  Tina Brennan Care Manager [facility]  

## 2020-11-28 ENCOUNTER — Other Ambulatory Visit: Payer: Self-pay

## 2020-12-07 ENCOUNTER — Other Ambulatory Visit: Payer: Self-pay

## 2020-12-10 ENCOUNTER — Other Ambulatory Visit: Payer: Self-pay

## 2020-12-11 ENCOUNTER — Other Ambulatory Visit: Payer: Self-pay

## 2020-12-13 ENCOUNTER — Other Ambulatory Visit: Payer: Self-pay

## 2020-12-26 ENCOUNTER — Other Ambulatory Visit: Payer: Self-pay

## 2021-01-07 ENCOUNTER — Other Ambulatory Visit: Payer: Self-pay

## 2021-01-31 ENCOUNTER — Other Ambulatory Visit: Payer: Self-pay

## 2021-01-31 MED FILL — Bupropion HCl Tab ER 24HR 150 MG: ORAL | 30 days supply | Qty: 30 | Fill #2 | Status: AC

## 2021-01-31 MED FILL — Metoprolol Tartrate Tab 50 MG: ORAL | 90 days supply | Qty: 180 | Fill #1 | Status: AC

## 2021-01-31 MED FILL — Metformin HCl Tab 500 MG: ORAL | 30 days supply | Qty: 60 | Fill #2 | Status: AC

## 2021-01-31 MED FILL — Lisinopril Tab 40 MG: ORAL | 30 days supply | Qty: 30 | Fill #2 | Status: AC

## 2021-02-06 ENCOUNTER — Other Ambulatory Visit: Payer: Self-pay

## 2021-02-06 MED ORDER — OMEPRAZOLE 20 MG PO CPDR
DELAYED_RELEASE_CAPSULE | ORAL | 1 refills | Status: AC
  Filled 2021-02-06 – 2021-10-23 (×2): qty 90, 90d supply, fill #0

## 2021-02-06 MED ORDER — ATORVASTATIN CALCIUM 10 MG PO TABS
ORAL_TABLET | ORAL | 1 refills | Status: AC
  Filled 2021-02-06 – 2021-10-23 (×2): qty 180, 90d supply, fill #0

## 2021-02-17 ENCOUNTER — Other Ambulatory Visit: Payer: Self-pay

## 2021-02-17 ENCOUNTER — Emergency Department
Admission: EM | Admit: 2021-02-17 | Discharge: 2021-02-17 | Disposition: A | Discharge status: Home / Self Care | Payer: Self-pay | Attending: Student in an Organized Health Care Education/Training Program | Admitting: Student in an Organized Health Care Education/Training Program

## 2021-02-17 DIAGNOSIS — R350 Frequency of micturition: Secondary | ICD-10-CM | POA: Insufficient documentation

## 2021-02-17 DIAGNOSIS — R3915 Urgency of urination: Secondary | ICD-10-CM | POA: Insufficient documentation

## 2021-02-17 DIAGNOSIS — R300 Dysuria: Secondary | ICD-10-CM | POA: Insufficient documentation

## 2021-02-17 DIAGNOSIS — Z5321 Procedure and treatment not carried out due to patient leaving prior to being seen by health care provider: Secondary | ICD-10-CM | POA: Insufficient documentation

## 2021-02-17 DIAGNOSIS — R21 Rash and other nonspecific skin eruption: Secondary | ICD-10-CM | POA: Insufficient documentation

## 2021-02-17 LAB — URINALYSIS, COMPLETE (UACMP) WITH MICROSCOPIC
Bacteria, UA: NONE SEEN
Bilirubin Urine: NEGATIVE
Glucose, UA: 100 mg/dL — AB
Nitrite: POSITIVE — AB
Specific Gravity, Urine: 1.02 (ref 1.005–1.030)
WBC, UA: >50 WBC/hpf (ref 0–5)
pH: 7.5 (ref 5.0–8.0)

## 2021-02-17 NOTE — ED Triage Notes (Signed)
Pt states that she has a hx of UTIs and has the symptoms of one now with dysuria and frequency and urgency- pt also having a rash on her back and on small spot under her umbilicus that she noticed 4 days ago

## 2021-02-19 ENCOUNTER — Other Ambulatory Visit: Payer: Self-pay

## 2021-02-19 ENCOUNTER — Encounter: Payer: Self-pay | Admitting: *Deleted

## 2021-02-19 ENCOUNTER — Emergency Department
Admission: EM | Admit: 2021-02-19 | Discharge: 2021-02-19 | Disposition: A | Discharge status: Home / Self Care | Payer: Self-pay | Attending: Emergency Medicine | Admitting: Emergency Medicine

## 2021-02-19 DIAGNOSIS — Z79899 Other long term (current) drug therapy: Secondary | ICD-10-CM | POA: Insufficient documentation

## 2021-02-19 DIAGNOSIS — N390 Urinary tract infection, site not specified: Secondary | ICD-10-CM | POA: Insufficient documentation

## 2021-02-19 DIAGNOSIS — R21 Rash and other nonspecific skin eruption: Secondary | ICD-10-CM | POA: Insufficient documentation

## 2021-02-19 DIAGNOSIS — E119 Type 2 diabetes mellitus without complications: Secondary | ICD-10-CM | POA: Insufficient documentation

## 2021-02-19 DIAGNOSIS — I10 Essential (primary) hypertension: Secondary | ICD-10-CM | POA: Insufficient documentation

## 2021-02-19 DIAGNOSIS — Z7984 Long term (current) use of oral hypoglycemic drugs: Secondary | ICD-10-CM | POA: Insufficient documentation

## 2021-02-19 DIAGNOSIS — Z8616 Personal history of COVID-19: Secondary | ICD-10-CM | POA: Insufficient documentation

## 2021-02-19 MED ORDER — CEPHALEXIN 500 MG PO CAPS
500.0000 mg | ORAL_CAPSULE | Freq: Two times a day (BID) | ORAL | 0 refills | Status: DC
  Filled 2021-02-19: qty 14, 7d supply, fill #0

## 2021-02-19 MED ORDER — PREDNISONE 20 MG PO TABS
40.0000 mg | ORAL_TABLET | Freq: Once | ORAL | Status: AC
  Administered 2021-02-19: 40 mg via ORAL
  Filled 2021-02-19: qty 2

## 2021-02-19 MED ORDER — PREDNISONE 10 MG PO TABS: 60.0000 mg | ORAL_TABLET | Freq: Every day | ORAL | 0 refills | Status: DC

## 2021-02-19 MED ORDER — CEPHALEXIN 500 MG PO CAPS
500.0000 mg | ORAL_CAPSULE | Freq: Once | ORAL | Status: AC
  Administered 2021-02-19: 500 mg via ORAL
  Filled 2021-02-19: qty 1

## 2021-02-19 MED ORDER — CEPHALEXIN 500 MG PO CAPS: 500.0000 mg | ORAL_CAPSULE | Freq: Two times a day (BID) | ORAL | 0 refills | Status: DC

## 2021-02-19 MED ORDER — PREDNISONE 10 MG PO TABS
60.0000 mg | ORAL_TABLET | Freq: Every day | ORAL | 0 refills | Status: DC
  Filled 2021-02-19: qty 20, 8d supply, fill #0

## 2021-02-19 NOTE — ED Provider Notes (Signed)
[facility] Emergency Department Provider Note  ____________________________________________   Event Date/Time   First MD Initiated Contact with Patient 02/19/21 [phone]     (approximate)  I have reviewed the triage vital signs and the nursing notes.   HISTORY  Chief Complaint Rash and Dysuria    HPI Tina Brennan is a 45 y.o. female with history of hypertension, diabetes, kidney stones who presents to the emergency department complaints of 10 days of dysuria.  States she has taken over-the-counter Azo without any relief.  She denies any fevers, vomiting, diarrhea, abnormal vaginal bleeding or discharge.  She is status post hysterectomy.  States she did have some gross hematuria with wiping but this has resolved.  Feels similar to previous urinary tract infections.  Denies any abdominal pain or flank pain.  States she does not think this is a kidney stone.  Was seen yesterday in the ED but left without being seen due to wait times.   Also reports in the past 5 days she has had a diffuse pruritic rash.  States it started on her abdomen and has moved to her back and chest.  Does not involve the palms, soles or mucous membranes.  No lip or tongue swelling.  No difficulty swallowing, speaking or breathing.  No tick bites, sick contacts, recent travel.  No exposure to anyone with monkeypox.  She has tried Benadryl with some relief of her itching but rash persists.  She denies any new exposures.         Past Medical History:  Diagnosis Date   COVID-19 06/2020   Diabetes mellitus without complication (HCC)    Hypertension    IBS (irritable bowel syndrome)    Kidney stones    Pancreatitis     Patient Active Problem List   Diagnosis Date Noted   Hypertension 10/07/2018   Migraine with status migrainosus 10/07/2018   IBS (irritable bowel syndrome) 09/23/2018   Headache 09/23/2018   Blurred vision, bilateral 09/23/2018   Healthcare maintenance 09/23/2018    BMI 33.0-33.9,adult 09/23/2018   Abdominal pain 02/16/2013   Diarrhea 02/16/2013   Nausea and vomiting 02/16/2013    Past Surgical History:  Procedure Laterality Date   ABDOMINAL HYSTERECTOMY     BLADDER SURGERY     cyst removal of ovaries      Prior to Admission medications   Medication Sig Start Date End Date Taking? Authorizing Provider  atorvastatin (LIPITOR) 10 MG tablet TAKE 2 TABLETS (20 MG) BY MOUTH ONCE DAILY AT BEDTIME FOR HIGH CHOLESTEROL 02/06/21 12/01/21    Bacillus Coagulans-Inulin (PROBIOTIC) 1-250 BILLION-MG CAPS Take 1 capsule by mouth daily. 10/05/20   Lenox Bink, Layla Maw, DO  buPROPion (WELLBUTRIN XL) 150 MG 24 hr tablet TAKE ONE TABLET BY MOUTH EVERY DAY 07/23/20 07/23/21  Shane Crutch, PA  cephALEXin (KEFLEX) 500 MG capsule Take 1 capsule (500 mg total) by mouth 2 (two) times daily. 02/19/21   Jurrell Royster, Layla Maw, DO  Cinnamon 500 MG TABS Take 500 mg by mouth daily.    [provider]  ciprofloxacin (CIPRO) 500 MG tablet Take 1 tablet (500 mg total) by mouth 2 (two) times daily. 10/05/20   Luisalberto Beegle, Layla Maw, DO  cloNIDine (CATAPRES) 0.1 MG tablet Take 2 tablets (0.2 mg total) by mouth 3 (three) times daily. Patient taking differently: Take 0.1 mg by mouth daily as needed. 10/07/18 10/07/19  Michiel Cowboy A, PA-C  dicyclomine (BENTYL) 20 MG tablet Take 1 tablet (20 mg total) by mouth 4 (four)  times daily -  before meals and at bedtime. 11/27/19   Minna Antis, MD  dicyclomine (BENTYL) 20 MG tablet TAKE 1 TABLET BY MOUTH FOUR TIMES A DAY 10/30/20     gabapentin (NEURONTIN) 600 MG tablet TAKE ONE TABLET BY MOUTH 3 TIMES A DAY 11/16/20 11/16/21    ibuprofen (ADVIL) 200 MG tablet Take 200 mg by mouth every 6 (six) hours as needed.    [provider]  lisinopril (ZESTRIL) 40 MG tablet Take 1 tablet (40 mg total) by mouth daily. 11/27/19   Minna Antis, MD  lisinopril (ZESTRIL) 40 MG tablet TAKE ONE TABLET BY MOUTH EVERY DAY FOR HIGH BLOOD PRESSURE 07/23/20 07/23/21   Shane Crutch, PA  lisinopril (ZESTRIL) 40 MG tablet TAKE ONE TABLET BY MOUTH EVERY DAY FOR HIGH BLOOD PRESSURE 04/16/20 04/16/21  Shane Crutch, PA  metFORMIN (GLUCOPHAGE) 500 MG tablet Take 1 tablet (500 mg total) by mouth 2 (two) times daily with a meal. 04/08/19   Bast, Traci A, NP  metFORMIN (GLUCOPHAGE) 500 MG tablet TAKE ONE TABLET BY MOUTH 2 TIMES A DAY FOR DIABETES. 07/23/20 07/23/21  Shane Crutch, PA  metoprolol succinate (TOPROL-XL) 100 MG 24 hr tablet TAKE 1 TABLET BY MOUTH ONCE DAILY TAKE  WITH  OR  IMMEDIATELY  FOLLOWING  A  MEAL 04/08/19   Bast, Traci A, NP  metoprolol tartrate (LOPRESSOR) 50 MG tablet TAKE ONE TABLET BY MOUTH 2 TIMES A DAY 06/06/20 06/06/21  Shane Crutch, PA  metroNIDAZOLE (FLAGYL) 500 MG tablet Take 1 tablet (500 mg total) by mouth 3 (three) times daily. 10/05/20   Ranferi Clingan, Layla Maw, DO  omeprazole (PRILOSEC) 20 MG capsule Take 1 capsule (20 mg total) by mouth daily. 09/30/18   Tukov-Yual, Alroy Bailiff, NP  omeprazole (PRILOSEC) 20 MG capsule TAKE ONE CAPSULE BY MOUTH EVERY DAY FOR REFLUX 02/06/21 11/22/21    oxyCODONE-acetaminophen (PERCOCET) 5-325 MG tablet Take 2 tablets by mouth every 6 (six) hours as needed for severe pain. 10/05/20 10/05/21  Sigmund Morera, Layla Maw, DO  predniSONE (DELTASONE) 10 MG tablet Take 6 tablets (60 mg total) by mouth daily. Take 40 mg x 2 days, then 30 mg x 2 days, then 20 mg x 2 days then 10 mg x 2 days 02/19/21   Sartaj Hoskin, Layla Maw, DO  promethazine (PHENERGAN) 25 MG tablet Take 1 tablet (25 mg total) by mouth every 6 (six) hours as needed for nausea or vomiting. 10/05/20   Marybeth Dandy, Layla Maw, DO  promethazine (PHENERGAN) 25 MG tablet Take 1 tablet (25 mg total) by mouth every 6 (six) hours as needed. 10/05/20   Rowena Moilanen, Layla Maw, DO    Allergies Penicillins, Demerol [meperidine], Reglan [metoclopramide], Toradol [ketorolac tromethamine], Zofran [ondansetron hcl], and Compazine [prochlorperazine edisylate]  No family history on file.  Social History Social History    Tobacco Use   Smoking status: Never   Smokeless tobacco: Never  Vaping Use   Vaping Use: Never used  Substance Use Topics   Alcohol use: No   Drug use: Not Currently    Types: Marijuana    Comment: occ    Review of Systems Constitutional: No fever. Eyes: No visual changes. ENT: No sore throat. Cardiovascular: Denies chest pain. Respiratory: Denies shortness of breath. Gastrointestinal: No nausea, vomiting, diarrhea. Genitourinary: +for dysuria. Musculoskeletal: Negative for back pain. Skin: +for rash. Neurological: Negative for focal weakness or numbness.  ____________________________________________   PHYSICAL EXAM:  VITAL SIGNS: ED Triage Vitals  Enc Vitals Group     BP 02/19/21  0028 (!) 156/103     Pulse Rate 02/19/21 0028 96     Resp 02/19/21 0028 18     Temp 02/19/21 0028 98 F (36.7 C)     Temp Source 02/19/21 0028 Oral     SpO2 02/19/21 0028 96 %     Weight 02/19/21 0518 190 lb (86.2 kg)     Height 02/19/21 0518 5\' 4"  (1.626 m)     Head Circumference --      Peak Flow --      Pain Score 02/19/21 0036 3     Pain Loc --      Pain Edu? --      Excl. in GC? --    CONSTITUTIONAL: Alert and oriented and responds appropriately to questions. Well-appearing; well-nourished HEAD: Normocephalic EYES: Conjunctivae clear, pupils appear equal, EOM appear intact ENT: normal nose; moist mucous membranes, normal phonation, no angioedema, no rash on the mucous membranes NECK: Supple, normal ROM CARD: RRR; S1 and S2 appreciated; no murmurs, no clicks, no rubs, no gallops RESP: Normal chest excursion without splinting or tachypnea; breath sounds clear and equal bilaterally; no wheezes, no rhonchi, no rales, no hypoxia or respiratory distress, speaking full sentences ABD/GI: Normal bowel sounds; non-distended; soft, non-tender, no rebound, no guarding, no peritoneal signs, no hepatosplenomegaly BACK: The back appears normal EXT: Normal ROM in all joints; no deformity  noted, no edema; no cyanosis SKIN: Normal color for age and race; warm; diffuse erythematous papular rash noted to her torso with no vesicles, blisters, desquamation, petechiae, purpura.  No rash involving the palms or mucous membranes. NEURO: Moves all extremities equally, ambulates with normal gait PSYCH: The patient's mood and manner are appropriate.  ____________________________________________   LABS (all labs ordered are listed, but only abnormal results are displayed)  Labs Reviewed  URINE CULTURE   ____________________________________________  EKG   ____________________________________________  RADIOLOGY I, Blaire Hodsdon, personally viewed and evaluated these images (plain radiographs) as part of my medical decision making, as well as reviewing the written report by the radiologist.  ED MD interpretation:    Official radiology report(s): No results found.  ____________________________________________   PROCEDURES  Procedure(s) performed (including Critical Care):  Procedures    ____________________________________________   INITIAL IMPRESSION / ASSESSMENT AND PLAN / ED COURSE  As part of my medical decision making, I reviewed the following data within the electronic MEDICAL RECORD NUMBER Nursing notes reviewed and incorporated, Labs reviewed , Old chart reviewed, and Notes from prior ED visits         Patient here with complaints of dysuria for the past 10 days.  Denies any abdominal pain, flank pain, fevers, vomiting.  She does have history of kidney stones but states this does not feel the same and declines a CT scan today.  Urine obtained 2 days ago appears infected.  She left without being seen at that time due to long wait times.  We will send urine culture today.  Will discharge on Keflex.  She has tolerated cephalosporins well despite penicillin allergy.  Patient also has diffuse pruritic rash.  This looks like a contact dermatitis.  No sign of SJS, TEN,  tickborne illness, vasculitis, scabies, monkeypox, anaphylaxis, angioedema.  Will discharge with steroid taper.  We did discuss the importance of monitoring her blood sugars very closely while on steroids.  We will have her continue Benadryl.  Given outpatient dermatology follow-up if symptoms or not improving.   At this time, I do not feel there is any  life-threatening condition present. I have reviewed, interpreted and discussed all results (EKG, imaging, lab, urine as appropriate) and exam findings with patient/family. I have reviewed nursing notes and appropriate previous records.  I feel the patient is safe to be discharged home without further emergent workup and can continue workup as an outpatient as needed. Discussed usual and customary return precautions. Patient/family verbalize understanding and are comfortable with this plan.  Outpatient follow-up has been provided as needed. All questions have been answered.  ____________________________________________   FINAL CLINICAL IMPRESSION(S) / ED DIAGNOSES  Final diagnoses:  Acute UTI  Rash     ED Discharge Orders          Ordered    cephALEXin (KEFLEX) 500 MG capsule  2 times daily,   Status:  Discontinued        02/19/21 0554    predniSONE (DELTASONE) 10 MG tablet  Daily,   Status:  Discontinued        02/19/21 0554    cephALEXin (KEFLEX) 500 MG capsule  2 times daily        02/19/21 0620    predniSONE (DELTASONE) 10 MG tablet  Daily        02/19/21 0620            *Please note:  Tina Brennan was evaluated in Emergency Department on 02/19/2021 for the symptoms described in the history of present illness. She was evaluated in the context of the global COVID-19 pandemic, which necessitated consideration that the patient might be at risk for infection with the SARS-CoV-2 virus that causes COVID-19. Institutional protocols and algorithms that pertain to the evaluation of patients at risk for COVID-19 are in a state of rapid  change based on information released by regulatory bodies including the CDC and federal and state organizations. These policies and algorithms were followed during the patient's care in the ED.  Some ED evaluations and interventions may be delayed as a result of limited staffing during and the pandemic.*   Note:  This document was prepared using Dragon voice recognition software and may include unintentional dictation errors.    Chevelle Durr, Layla Maw, DO 02/19/21 [phone]

## 2021-02-19 NOTE — Discharge Instructions (Addendum)
You may continue benadryl 25 - 50 mg every 6 hours as needed for itching.   We are starting you on steroids for your rash.  You will need to monitor your blood sugars very closely and make sure you are taking all of your medications for diabetes as prescribed as steroids can raise your blood sugar.  If your sugar is ever over 500 and does not come down or you have increased respiratory rate, vomiting, abdominal pain, confusion, please return to the emergency department.

## 2021-02-19 NOTE — ED Notes (Signed)
Verified correct patient and correct discharge papers given. Pt alert and oriented X 4, stable for discharge. RR even and unlabored, color WNL. Discussed discharge instructions and follow-up as directed. Discharge medications discussed, when prescribed. Pt had opportunity to ask questions, and RN available to provide patient and/or family education.   

## 2021-02-19 NOTE — ED Triage Notes (Signed)
Pt has a itching rash on arms, back and head.  Pt also has painfful urination .  Pt was here last night but left without seeing a provider.  Pt alert  speech clear.

## 2021-02-20 LAB — URINE CULTURE: Culture: 8000 — AB

## 2021-02-22 ENCOUNTER — Other Ambulatory Visit: Payer: Self-pay

## 2021-02-22 MED ORDER — DICYCLOMINE HCL 20 MG PO TABS
20.0000 mg | ORAL_TABLET | Freq: Four times a day (QID) | ORAL | 3 refills | Status: DC
  Filled 2021-02-22: qty 120, 30d supply, fill #0
  Filled 2021-03-18: qty 120, 30d supply, fill #1
  Filled 2021-04-12: qty 120, 30d supply, fill #2
  Filled 2021-05-10: qty 120, 30d supply, fill #3

## 2021-03-15 ENCOUNTER — Other Ambulatory Visit: Payer: Self-pay

## 2021-03-15 ENCOUNTER — Emergency Department: Payer: Self-pay

## 2021-03-15 ENCOUNTER — Encounter: Payer: Self-pay | Admitting: Emergency Medicine

## 2021-03-15 ENCOUNTER — Emergency Department
Admission: EM | Admit: 2021-03-15 | Discharge: 2021-03-15 | Disposition: A | Discharge status: Home / Self Care | Payer: Self-pay | Attending: Emergency Medicine | Admitting: Emergency Medicine

## 2021-03-15 DIAGNOSIS — Z8616 Personal history of COVID-19: Secondary | ICD-10-CM | POA: Insufficient documentation

## 2021-03-15 DIAGNOSIS — Z7984 Long term (current) use of oral hypoglycemic drugs: Secondary | ICD-10-CM | POA: Insufficient documentation

## 2021-03-15 DIAGNOSIS — R109 Unspecified abdominal pain: Secondary | ICD-10-CM

## 2021-03-15 DIAGNOSIS — R1012 Left upper quadrant pain: Secondary | ICD-10-CM | POA: Insufficient documentation

## 2021-03-15 DIAGNOSIS — E119 Type 2 diabetes mellitus without complications: Secondary | ICD-10-CM | POA: Insufficient documentation

## 2021-03-15 DIAGNOSIS — Z79899 Other long term (current) drug therapy: Secondary | ICD-10-CM | POA: Insufficient documentation

## 2021-03-15 DIAGNOSIS — R110 Nausea: Secondary | ICD-10-CM | POA: Insufficient documentation

## 2021-03-15 DIAGNOSIS — I10 Essential (primary) hypertension: Secondary | ICD-10-CM | POA: Insufficient documentation

## 2021-03-15 LAB — COMPREHENSIVE METABOLIC PANEL
ALT: 25 U/L (ref 0–44)
AST: 27 U/L (ref 15–41)
Albumin: 4.2 g/dL (ref 3.5–5.0)
Alkaline Phosphatase: 84 U/L (ref 38–126)
Anion gap: 10 (ref 5–15)
BUN: 14 mg/dL (ref 6–20)
CO2: 30 mmol/L (ref 22–32)
Calcium: 9.7 mg/dL (ref 8.9–10.3)
Chloride: 97 mmol/L — ABNORMAL LOW (ref 98–111)
Creatinine, Ser: 0.66 mg/dL (ref 0.44–1.00)
GFR, Estimated: >60 mL/min (ref >60)
Glucose, Bld: 121 mg/dL — ABNORMAL HIGH (ref 70–99)
Potassium: 4 mmol/L (ref 3.5–5.1)
Sodium: 137 mmol/L (ref 135–145)
Total Bilirubin: 0.6 mg/dL (ref 0.3–1.2)
Total Protein: 7.9 g/dL (ref 6.5–8.1)

## 2021-03-15 LAB — URINALYSIS, COMPLETE (UACMP) WITH MICROSCOPIC
Bacteria, UA: NONE SEEN
Bilirubin Urine: NEGATIVE
Glucose, UA: NEGATIVE mg/dL
Hgb urine dipstick: NEGATIVE
Ketones, ur: NEGATIVE mg/dL
Nitrite: NEGATIVE
Protein, ur: NEGATIVE mg/dL
Specific Gravity, Urine: 1.006 (ref 1.005–1.030)
Squamous Epithelial / HPF: NONE SEEN (ref 0–5)
pH: 9 — ABNORMAL HIGH (ref 5.0–8.0)

## 2021-03-15 LAB — LIPASE, BLOOD: Lipase: 41 U/L (ref 11–51)

## 2021-03-15 LAB — CBC
HCT: 41.5 % (ref 36.0–46.0)
Hemoglobin: 15.2 g/dL — ABNORMAL HIGH (ref 12.0–15.0)
MCH: 33.3 pg (ref 26.0–34.0)
MCHC: 36.6 g/dL — ABNORMAL HIGH (ref 30.0–36.0)
MCV: 91 fL (ref 80.0–100.0)
Platelets: 240 10*3/uL (ref 150–400)
RBC: 4.56 MIL/uL (ref 3.87–5.11)
RDW: 12.2 % (ref 11.5–15.5)
WBC: 8 10*3/uL (ref 4.0–10.5)
nRBC: 0 % (ref 0.0–0.2)

## 2021-03-15 LAB — TROPONIN I (HIGH SENSITIVITY): Troponin I (High Sensitivity): 4 ng/L (ref <18)

## 2021-03-15 MED ORDER — SODIUM CHLORIDE 0.9 % IV SOLN
25.0000 mg | Freq: Once | INTRAVENOUS | Status: AC
  Administered 2021-03-15: 25 mg via INTRAVENOUS
  Filled 2021-03-15: qty 1

## 2021-03-15 MED ORDER — MORPHINE SULFATE (PF) 4 MG/ML IV SOLN
4.0000 mg | Freq: Once | INTRAVENOUS | Status: AC
  Administered 2021-03-15: 4 mg via INTRAVENOUS
  Filled 2021-03-15: qty 1

## 2021-03-15 MED ORDER — IBUPROFEN 800 MG PO TABS: 800.0000 mg | ORAL_TABLET | Freq: Three times a day (TID) | ORAL | 0 refills | Status: DC | PRN

## 2021-03-15 MED ORDER — PROMETHAZINE HCL 25 MG PO TABS: 25.0000 mg | ORAL_TABLET | Freq: Four times a day (QID) | ORAL | 0 refills | Status: AC | PRN

## 2021-03-15 MED ORDER — SODIUM CHLORIDE 0.9 % IV BOLUS (SEPSIS)
1000.0000 mL | Freq: Once | INTRAVENOUS | Status: AC
  Administered 2021-03-15: 1000 mL via INTRAVENOUS

## 2021-03-15 MED ORDER — LIDOCAINE 5 % EX PTCH: 1.0000 | MEDICATED_PATCH | Freq: Two times a day (BID) | CUTANEOUS | 0 refills | Status: AC

## 2021-03-15 MED ORDER — IOHEXOL 350 MG/ML SOLN
80.0000 mL | Freq: Once | INTRAVENOUS | Status: AC | PRN
  Administered 2021-03-15: 80 mL via INTRAVENOUS

## 2021-03-15 MED ORDER — HYDROCODONE-ACETAMINOPHEN 5-325 MG PO TABS: 2.0000 | ORAL_TABLET | Freq: Four times a day (QID) | ORAL | 0 refills | Status: AC | PRN

## 2021-03-15 NOTE — ED Provider Notes (Signed)
[facility] Emergency Department Provider Note  ____________________________________________   Event Date/Time   First MD Initiated Contact with Patient 03/15/21 [phone]     (approximate)  I have reviewed the triage vital signs and the nursing notes.   HISTORY  Chief Complaint Abdominal Pain    HPI Tina Brennan is a 45 y.o. female with history of hypertension, diabetes, kidney stones, IBS, previous pancreatitis who presents to the emergency department with complaints of sudden onset left flank and left-sided abdominal pain that started 2 days ago.  States it started when she was lying on her side and rolled over and felt something pop and felt like something moved.  She states her pain is progressively worsened and she now feels her abdomen is distended.  Her mother was concerned that this could be constipation or bowel obstruction as she has had a bowel obstruction before.  She tried magnesium citrate and has been having bowel movements and passing gas.  States she has had nausea but no vomiting.  No chest pain, shortness of breath, cough.  No numbness, tingling, weakness, bowel or bladder incontinence, urinary retention.  She has had previous hysterectomy, appendectomy, bladder surgery.  No history of PE, DVT, exogenous estrogen use, recent fractures, surgery, trauma, hospitalization, prolonged travel or other immobilization. No lower extremity swelling or pain. No calf tenderness.  She denies dysuria, hematuria, vaginal bleeding or discharge.  States this feels different than her previous kidney stones or pancreatitis.        Past Medical History:  Diagnosis Date   COVID-19 06/2020   Diabetes mellitus without complication (HCC)    Hypertension    IBS (irritable bowel syndrome)    Kidney stones    Pancreatitis     Patient Active Problem List   Diagnosis Date Noted   Hypertension 10/07/2018   Migraine with status migrainosus 10/07/2018   IBS  (irritable bowel syndrome) 09/23/2018   Headache 09/23/2018   Blurred vision, bilateral 09/23/2018   Healthcare maintenance 09/23/2018   BMI 33.0-33.9,adult 09/23/2018   Abdominal pain 02/16/2013   Diarrhea 02/16/2013   Nausea and vomiting 02/16/2013    Past Surgical History:  Procedure Laterality Date   ABDOMINAL HYSTERECTOMY     BLADDER SURGERY     cyst removal of ovaries      Prior to Admission medications   Medication Sig Start Date End Date Taking? Authorizing Provider  HYDROcodone-acetaminophen (NORCO/VICODIN) 5-325 MG tablet Take 2 tablets by mouth every 6 (six) hours as needed. 03/15/21  Yes Amun Stemm, Layla Maw, DO  ibuprofen (ADVIL) 800 MG tablet Take 1 tablet (800 mg total) by mouth every 8 (eight) hours as needed for mild pain. 03/15/21  Yes Virgie Chery N, DO  lidocaine (LIDODERM) 5 % Place 1 patch onto the skin every 12 (twelve) hours for 5 days. Remove & Discard patch within 12 hours or as directed by MD 03/15/21 03/20/21 Yes Chontel Warning, Layla Maw, DO  promethazine (PHENERGAN) 25 MG tablet Take 1 tablet (25 mg total) by mouth every 6 (six) hours as needed for nausea or vomiting. 03/15/21  Yes Jehiel Koepp, Baxter Hire N, DO  atorvastatin (LIPITOR) 10 MG tablet TAKE 2 TABLETS (20 MG) BY MOUTH ONCE DAILY AT BEDTIME FOR HIGH CHOLESTEROL 02/06/21 12/01/21    Bacillus Coagulans-Inulin (PROBIOTIC) 1-250 BILLION-MG CAPS Take 1 capsule by mouth daily. 10/05/20   Ellianne Gowen, Layla Maw, DO  buPROPion (WELLBUTRIN XL) 150 MG 24 hr tablet TAKE ONE TABLET BY MOUTH EVERY DAY 07/23/20 07/23/21  Shane Crutch, PA  cephALEXin (KEFLEX) 500 MG capsule Take 1 capsule (500 mg total) by mouth 2 (two) times daily for 7 days. 02/19/21   Trenna Kiely, Layla Maw, DO  Cinnamon 500 MG TABS Take 500 mg by mouth daily.    [provider]  ciprofloxacin (CIPRO) 500 MG tablet Take 1 tablet (500 mg total) by mouth 2 (two) times daily. 10/05/20   Deagan Sevin, Layla Maw, DO  cloNIDine (CATAPRES) 0.1 MG tablet Take 2 tablets (0.2 mg total) by mouth 3  (three) times daily. Patient taking differently: Take 0.1 mg by mouth daily as needed. 10/07/18 10/07/19  Michiel Cowboy A, PA-C  dicyclomine (BENTYL) 20 MG tablet Take 1 tablet (20 mg total) by mouth 4 (four) times daily -  before meals and at bedtime. 11/27/19   Minna Antis, MD  dicyclomine (BENTYL) 20 MG tablet TAKE 1 TABLET BY MOUTH FOUR TIMES A DAY 02/22/21     gabapentin (NEURONTIN) 600 MG tablet TAKE ONE TABLET BY MOUTH 3 TIMES A DAY 11/16/20 11/16/21    lisinopril (ZESTRIL) 40 MG tablet Take 1 tablet (40 mg total) by mouth daily. 11/27/19   Minna Antis, MD  lisinopril (ZESTRIL) 40 MG tablet TAKE ONE TABLET BY MOUTH EVERY DAY FOR HIGH BLOOD PRESSURE 07/23/20 07/23/21  Shane Crutch, PA  lisinopril (ZESTRIL) 40 MG tablet TAKE ONE TABLET BY MOUTH EVERY DAY FOR HIGH BLOOD PRESSURE 04/16/20 04/16/21  Shane Crutch, PA  metFORMIN (GLUCOPHAGE) 500 MG tablet Take 1 tablet (500 mg total) by mouth 2 (two) times daily with a meal. 04/08/19   Bast, Traci A, NP  metFORMIN (GLUCOPHAGE) 500 MG tablet TAKE ONE TABLET BY MOUTH 2 TIMES A DAY FOR DIABETES. 07/23/20 07/23/21  Shane Crutch, PA  metoprolol succinate (TOPROL-XL) 100 MG 24 hr tablet TAKE 1 TABLET BY MOUTH ONCE DAILY TAKE  WITH  OR  IMMEDIATELY  FOLLOWING  A  MEAL 04/08/19   Bast, Traci A, NP  metoprolol tartrate (LOPRESSOR) 50 MG tablet TAKE ONE TABLET BY MOUTH 2 TIMES A DAY 06/06/20 06/06/21  Shane Crutch, PA  metroNIDAZOLE (FLAGYL) 500 MG tablet Take 1 tablet (500 mg total) by mouth 3 (three) times daily. 10/05/20   Denny Mccree, Layla Maw, DO  omeprazole (PRILOSEC) 20 MG capsule Take 1 capsule (20 mg total) by mouth daily. 09/30/18   Tukov-Yual, Alroy Bailiff, NP  omeprazole (PRILOSEC) 20 MG capsule TAKE ONE CAPSULE BY MOUTH EVERY DAY FOR REFLUX 02/06/21 11/22/21    oxyCODONE-acetaminophen (PERCOCET) 5-325 MG tablet Take 2 tablets by mouth every 6 (six) hours as needed for severe pain. 10/05/20 10/05/21  Kenzee Bassin, Layla Maw, DO  predniSONE (DELTASONE) 10 MG tablet  TAKE 4 TABLETS (40MG ) BY MOUTH ONCE DAILY FOR 2 DAYS. THEN TAKE 3 TABS (30MG ) DAILY FOR 2 DAYS. THEN TAKE 2 TABS (20MG ) DAILY FOR 2 DAYS. THEN TAKE 1 TAB (10MG ) ONCE FOR 2 DAYS. 02/19/21   Jaquane Boughner, , DO    Allergies Penicillins, Demerol [meperidine], Reglan [metoclopramide], Toradol [ketorolac tromethamine], Zofran [ondansetron hcl], and Compazine [prochlorperazine edisylate]  History reviewed. No pertinent family history.  Social History Social History   Tobacco Use   Smoking status: Never   Smokeless tobacco: Never  Vaping Use   Vaping Use: Never used  Substance Use Topics   Alcohol use: No   Drug use: Not Currently    Types: Marijuana    Comment: occ    Review of Systems Constitutional: No fever. Eyes: No visual changes. ENT: No sore throat. Cardiovascular: Denies chest pain. Respiratory: Denies shortness of  breath. Gastrointestinal: No vomiting, diarrhea. Genitourinary: Negative for dysuria. Musculoskeletal: Negative for back pain. Skin: Negative for rash. Neurological: Negative for focal weakness or numbness.  ____________________________________________   PHYSICAL EXAM:  VITAL SIGNS: ED Triage Vitals  Enc Vitals Group     BP 03/15/21 0225 (!) 176/101     Pulse Rate 03/15/21 0225 73     Resp 03/15/21 0225 18     Temp 03/15/21 0225 97.7 F (36.5 C)     Temp Source 03/15/21 0225 Oral     SpO2 03/15/21 0225 97 %     Weight 03/15/21 0239 180 lb (81.6 kg)     Height 03/15/21 0239 5\' 4"  (1.626 m)     Head Circumference --      Peak Flow --      Pain Score 03/15/21 0239 5     Pain Loc --      Pain Edu? --      Excl. in [place]? --    CONSTITUTIONAL: Alert and oriented and responds appropriately to questions.  Appears uncomfortable, afebrile, nontoxic HEAD: Normocephalic EYES: Conjunctivae clear, pupils appear equal, EOM appear intact ENT: normal nose; moist mucous membranes NECK: Supple, normal ROM CARD: RRR; S1 and S2 appreciated; no murmurs, no clicks,  no rubs, no gallops RESP: Normal chest excursion without splinting or tachypnea; breath sounds clear and equal bilaterally; no wheezes, no rhonchi, no rales, no hypoxia or respiratory distress, speaking full sentences ABD/GI: Normal bowel sounds; slightly distended but no tympany or fluid wave, soft, tender to palpation diffusely throughout the left upper and lower quadrants and left flank without guarding or rebound BACK: The back appears normal, no midline spinal tenderness or step-off or deformity EXT: Normal ROM in all joints; no deformity noted, no edema; no cyanosis SKIN: Normal color for age and race; warm; no rash on exposed skin NEURO: Moves all extremities equally, normal sensation diffusely, normal gait, no saddle anesthesia PSYCH: The patient's mood and manner are appropriate.  ____________________________________________   LABS (all labs ordered are listed, but only abnormal results are displayed)  Labs Reviewed  COMPREHENSIVE METABOLIC PANEL - Abnormal; Notable for the following components:      Result Value   Chloride 97 (*)    Glucose, Bld 121 (*)    All other components within normal limits  CBC - Abnormal; Notable for the following components:   Hemoglobin 15.2 (*)    MCHC 36.6 (*)    All other components within normal limits  URINALYSIS, COMPLETE (UACMP) WITH MICROSCOPIC - Abnormal; Notable for the following components:   Color, Urine STRAW (*)    APPearance CLEAR (*)    pH 9.0 (*)    Leukocytes,Ua SMALL (*)    All other components within normal limits  LIPASE, BLOOD  TROPONIN I (HIGH SENSITIVITY)   ____________________________________________  EKG   EKG Interpretation  Date/Time:  Friday March 15 2021 06:03:06 EDT Ventricular Rate:  67 PR Interval:  178 QRS Duration: 97 QT Interval:  432 QTC Calculation: 457 R Axis:   137 Text Interpretation: Right and left arm electrode reversal, interpretation assumes no reversal Sinus rhythm Probable lateral  infarct, old Baseline wander in lead(s) V3 Confirmed by [id number] [phone]) on 03/15/2021 6:21:37 AM        ____________________________________________  RADIOLOGY 03/17/2021 Argelio Granier, personally viewed and evaluated these images (plain radiographs) as part of my medical decision making, as well as reviewing the written report by the radiologist.  ED MD interpretation: CT shows no acute  abnormality.  Official radiology report(s): CT ABDOMEN PELVIS W CONTRAST  Result Date: 03/15/2021 CLINICAL DATA:  Acute, nonlocalized abdominal pain EXAM: CT ABDOMEN AND PELVIS WITH CONTRAST TECHNIQUE: Multidetector CT imaging of the abdomen and pelvis was performed using the standard protocol following bolus administration of intravenous contrast. CONTRAST:  87mL OMNIPAQUE IOHEXOL 350 MG/ML SOLN COMPARISON:  10/05/2020 FINDINGS: Lower chest:  No contributory findings. Hepatobiliary: Hepatic steatosis.No evidence of biliary obstruction or stone. Pancreas: Unremarkable. Spleen: Unremarkable. Adrenals/Urinary Tract: Negative adrenals. No hydronephrosis or stone. Unremarkable bladder. Stomach/Bowel:  No obstruction. No visible bowel inflammation. Vascular/Lymphatic: No acute vascular abnormality. No mass or adenopathy. Reproductive:Hysterectomy. Other: No ascites or pneumoperitoneum. Musculoskeletal: No acute abnormalities. IMPRESSION: 1. No acute finding or specific cause for pain. 2. Hepatic steatosis. Electronically Signed   By: Tiburcio Pea M.D.   On: 03/15/2021 06:52    ____________________________________________   PROCEDURES  Procedure(s) performed (including Critical Care):  Procedures   ____________________________________________   INITIAL IMPRESSION / ASSESSMENT AND PLAN / ED COURSE  As part of my medical decision making, I reviewed the following data within the electronic MEDICAL RECORD NUMBER Nursing notes reviewed and incorporated, Labs reviewed , EKG interpreted , Old EKG reviewed, Old chart  reviewed, CT reviewed, Notes from prior ED visits, and [place] Controlled Substance Database         Patient here with left-sided abdominal pain, flank pain.  Differential includes musculoskeletal pain, pancreatitis, colitis, diverticulitis, kidney stone, pyelonephritis.  She denies any chest pain or shortness of breath.  She is PERC negative.  Doubt ACS, PE.  No fever, cough to suggest pneumonia.  Labs obtained from triage are reassuring with normal white blood cell count, LFTs, lipase, creatinine.  Urine shows no sign of infection.  Will give pain and nausea medicine.  She states she can call someone to drive her home.  Will obtain CT of the abdomen pelvis for further evaluation.  ED PROGRESS  EKG shows no ischemic abnormality.  Troponin negative.  CT of the abdomen pelvis shows no acute abnormality including no sign of pancreatitis, pyelonephritis, kidney stone, bowel obstruction.  Discussed with patient that I suspect this is likely musculoskeletal pain.  Will discharge with prescriptions of Vicodin, ibuprofen, Lidoderm patches.  She will have her mother come pick her up from the emergency department.  I feel she is safe for discharge home.   At this time, I do not feel there is any life-threatening condition present. I have reviewed, interpreted and discussed all results (EKG, imaging, lab, urine as appropriate) and exam findings with patient/family. I have reviewed nursing notes and appropriate previous records.  I feel the patient is safe to be discharged home without further emergent workup and can continue workup as an outpatient as needed. Discussed usual and customary return precautions. Patient/family verbalize understanding and are comfortable with this plan.  Outpatient follow-up has been provided as needed. All questions have been answered.  ____________________________________________   FINAL CLINICAL IMPRESSION(S) / ED DIAGNOSES  Final diagnoses:  Left upper quadrant abdominal pain   Left flank pain     ED Discharge Orders          Ordered    HYDROcodone-acetaminophen (NORCO/VICODIN) 5-325 MG tablet  Every 6 hours PRN        03/15/21 0721    promethazine (PHENERGAN) 25 MG tablet  Every 6 hours PRN        03/15/21 0721    lidocaine (LIDODERM) 5 %  Every 12 hours  03/15/21 0721    ibuprofen (ADVIL) 800 MG tablet  Every 8 hours PRN        03/15/21 5916            *Please note:  Dunya Meiners was evaluated in Emergency Department on 03/15/2021 for the symptoms described in the history of present illness. She was evaluated in the context of the global COVID-19 pandemic, which necessitated consideration that the patient might be at risk for infection with the SARS-CoV-2 virus that causes COVID-19. Institutional protocols and algorithms that pertain to the evaluation of patients at risk for COVID-19 are in a state of rapid change based on information released by regulatory bodies including the CDC and federal and state organizations. These policies and algorithms were followed during the patient's care in the ED.  Some ED evaluations and interventions may be delayed as a result of limited staffing during and the pandemic.*   Note:  This document was prepared using Dragon voice recognition software and may include unintentional dictation errors.    Mashayla Lavin, Layla Maw, DO 03/15/21 [phone]

## 2021-03-15 NOTE — Discharge Instructions (Addendum)
Your labs, EKG, urine and CT of your abdomen pelvis were normal today.  I suspect that your back pain, abdominal pain is more musculoskeletal in nature.  You may alternate between heat and ice to your back.  I recommend not lifting anything over 10 pounds for the next several days.  You are being provided a prescription for opiates (also known as narcotics) for pain control.  Opiates can be addictive and should only be used when absolutely necessary for pain control when other alternatives do not work.  We recommend you only use them for the recommended amount of time and only as prescribed.  Please do not take with other sedative medications or alcohol.  Please do not drive, operate machinery, make important decisions while taking opiates.  Please note that these medications can be addictive and have high abuse potential.  Patients can become addicted to narcotics after only taking them for a few days.  Please keep these medications locked away from children, teenagers or any family members with history of substance abuse.  Narcotic pain medicine may also make you constipated.  You may use over-the-counter medications such as MiraLAX, Colace to prevent constipation.  If you become constipated you may use over-the-counter enemas as needed.  Itching and nausea are common side effects of narcotic pain medication.  If you develop uncontrolled vomiting or a rash, please stop these medications.

## 2021-03-15 NOTE — ED Notes (Signed)
Lab called as we were unable to obtain troponin from IV start. Will send phlebotomy for lab draw.

## 2021-03-15 NOTE — ED Notes (Signed)
Signature pad not working. Pt gave verbal consent.

## 2021-03-15 NOTE — ED Triage Notes (Signed)
Pt to ED from home c/o left abd pain for a couple days.  States rolled over in bed and felt a pop and painful since then.  Nausea without vomiting, used magcitrate at home thinking constipation and having BMs at home, states some cloudy urine but recently finished ABX for UTI and no other urinary symptoms.  Movement and lying on left side makes pain worse.  Pt A&Ox4, chest rise even and unlabored, skin WNL and in NAD at this time.

## 2021-03-15 NOTE — ED Notes (Signed)
Patient transported to CT 

## 2021-03-18 ENCOUNTER — Other Ambulatory Visit: Payer: Self-pay

## 2021-03-18 MED ORDER — LISINOPRIL 40 MG PO TABS
ORAL_TABLET | Freq: Every day | ORAL | 0 refills | Status: DC
  Filled 2021-03-18: qty 90, 90d supply, fill #0

## 2021-03-20 ENCOUNTER — Other Ambulatory Visit: Payer: Self-pay

## 2021-03-21 ENCOUNTER — Other Ambulatory Visit: Payer: Self-pay

## 2021-03-21 MED ORDER — METFORMIN HCL 500 MG PO TABS
ORAL_TABLET | ORAL | 0 refills | Status: DC
  Filled 2021-03-21: qty 180, 90d supply, fill #0

## 2021-04-02 ENCOUNTER — Other Ambulatory Visit: Payer: Self-pay

## 2021-04-02 MED ORDER — TRIAMCINOLONE ACETONIDE 0.1 % EX CREA
TOPICAL_CREAM | CUTANEOUS | 0 refills | Status: DC
  Filled 2021-04-02: qty 80, 30d supply, fill #0

## 2021-04-12 ENCOUNTER — Other Ambulatory Visit: Payer: Self-pay

## 2021-05-10 ENCOUNTER — Other Ambulatory Visit: Payer: Self-pay

## 2021-06-04 ENCOUNTER — Other Ambulatory Visit: Payer: Self-pay

## 2021-06-04 MED ORDER — LISINOPRIL 40 MG PO TABS
ORAL_TABLET | Freq: Every day | ORAL | 0 refills | Status: DC
  Filled 2021-06-04: qty 90, 90d supply, fill #0

## 2021-06-04 MED ORDER — DICYCLOMINE HCL 20 MG PO TABS
20.0000 mg | ORAL_TABLET | Freq: Four times a day (QID) | ORAL | 3 refills | Status: DC
  Filled 2021-06-04 – 2021-06-05 (×2): qty 120, 30d supply, fill #0
  Filled 2021-06-26: qty 120, 30d supply, fill #1
  Filled 2021-07-24: qty 120, 30d supply, fill #2
  Filled 2021-08-14: qty 100, 25d supply, fill #3
  Filled 2021-08-19: qty 120, 30d supply, fill #3

## 2021-06-04 MED ORDER — GABAPENTIN 600 MG PO TABS
ORAL_TABLET | Freq: Three times a day (TID) | ORAL | 0 refills | Status: DC
  Filled 2021-06-04: qty 90, 30d supply, fill #0
  Filled 2021-07-04: qty 90, 30d supply, fill #1
  Filled 2021-08-05: qty 90, 30d supply, fill #2

## 2021-06-04 MED ORDER — TRIAMCINOLONE ACETONIDE 0.1 % EX CREA
TOPICAL_CREAM | CUTANEOUS | 0 refills | Status: AC
  Filled 2021-06-04: qty 80, 60d supply, fill #0

## 2021-06-04 MED ORDER — METFORMIN HCL 500 MG PO TABS
ORAL_TABLET | ORAL | 0 refills | Status: AC
  Filled 2021-06-04: qty 180, fill #0
  Filled 2021-10-23: qty 180, 90d supply, fill #0

## 2021-06-05 ENCOUNTER — Other Ambulatory Visit: Payer: Self-pay

## 2021-06-14 ENCOUNTER — Other Ambulatory Visit: Payer: Self-pay

## 2021-06-18 ENCOUNTER — Other Ambulatory Visit: Payer: Self-pay

## 2021-06-18 MED ORDER — BUPROPION HCL ER (XL) 150 MG PO TB24
ORAL_TABLET | Freq: Every day | ORAL | 2 refills | Status: AC
  Filled 2021-06-18 – 2021-10-23 (×2): qty 30, 30d supply, fill #0

## 2021-06-18 MED ORDER — METOPROLOL TARTRATE 50 MG PO TABS
ORAL_TABLET | Freq: Two times a day (BID) | ORAL | 2 refills | Status: AC
  Filled 2021-06-18 – 2021-10-23 (×2): qty 180, 90d supply, fill #0

## 2021-06-25 ENCOUNTER — Other Ambulatory Visit: Payer: Self-pay

## 2021-06-26 ENCOUNTER — Other Ambulatory Visit: Payer: Self-pay

## 2021-07-04 ENCOUNTER — Other Ambulatory Visit: Payer: Self-pay

## 2021-07-16 ENCOUNTER — Other Ambulatory Visit: Payer: Self-pay

## 2021-07-20 ENCOUNTER — Other Ambulatory Visit: Payer: Self-pay

## 2021-07-20 DIAGNOSIS — M5413 Radiculopathy, cervicothoracic region: Secondary | ICD-10-CM | POA: Insufficient documentation

## 2021-07-20 NOTE — ED Triage Notes (Signed)
Pt states left sided shoulder pain that pt states she cannot get comfortable or sleep. Pt states pain is under shoulder blade and radiates to left elbow. Pt states has been present for 4 days.

## 2021-07-21 ENCOUNTER — Encounter: Payer: Self-pay | Admitting: Radiology

## 2021-07-21 ENCOUNTER — Emergency Department: Payer: Self-pay

## 2021-07-21 ENCOUNTER — Emergency Department
Admission: EM | Admit: 2021-07-21 | Discharge: 2021-07-21 | Disposition: A | Discharge status: Home / Self Care | Payer: Self-pay | Attending: Emergency Medicine | Admitting: Emergency Medicine

## 2021-07-21 DIAGNOSIS — M5413 Radiculopathy, cervicothoracic region: Secondary | ICD-10-CM

## 2021-07-21 LAB — BASIC METABOLIC PANEL
Anion gap: 8 (ref 5–15)
BUN: 13 mg/dL (ref 6–20)
CO2: 25 mmol/L (ref 22–32)
Calcium: 9.5 mg/dL (ref 8.9–10.3)
Chloride: 98 mmol/L (ref 98–111)
Creatinine, Ser: 0.65 mg/dL (ref 0.44–1.00)
GFR, Estimated: >60 mL/min (ref >60)
Glucose, Bld: 209 mg/dL — ABNORMAL HIGH (ref 70–99)
Potassium: 4.5 mmol/L (ref 3.5–5.1)
Sodium: 131 mmol/L — ABNORMAL LOW (ref 135–145)

## 2021-07-21 LAB — CBC
HCT: 39.8 % (ref 36.0–46.0)
Hemoglobin: 13.8 g/dL (ref 12.0–15.0)
MCH: 30.8 pg (ref 26.0–34.0)
MCHC: 34.7 g/dL (ref 30.0–36.0)
MCV: 88.8 fL (ref 80.0–100.0)
Platelets: 245 10*3/uL (ref 150–400)
RBC: 4.48 MIL/uL (ref 3.87–5.11)
RDW: 12.2 % (ref 11.5–15.5)
WBC: 7.6 10*3/uL (ref 4.0–10.5)
nRBC: 0 % (ref 0.0–0.2)

## 2021-07-21 LAB — TROPONIN I (HIGH SENSITIVITY): Troponin I (High Sensitivity): 5 ng/L (ref <18)

## 2021-07-21 MED ORDER — PREDNISONE 10 MG PO TABS
ORAL_TABLET | ORAL | 0 refills | Status: DC
  Filled 2021-07-24: qty 21, 6d supply, fill #0

## 2021-07-21 MED ORDER — OXYCODONE-ACETAMINOPHEN 5-325 MG PO TABS
1.0000 | ORAL_TABLET | Freq: Once | ORAL | Status: AC
  Administered 2021-07-21: 1 via ORAL
  Filled 2021-07-21: qty 1

## 2021-07-21 MED ORDER — PREDNISONE 20 MG PO TABS
60.0000 mg | ORAL_TABLET | Freq: Once | ORAL | Status: AC
  Administered 2021-07-21: 60 mg via ORAL
  Filled 2021-07-21: qty 3

## 2021-07-21 MED ORDER — OXYCODONE-ACETAMINOPHEN 5-325 MG PO TABS: 1.0000 | ORAL_TABLET | Freq: Three times a day (TID) | ORAL | 0 refills | Status: DC | PRN

## 2021-07-21 MED ORDER — PREDNISONE 10 MG PO TABS: 60.0000 mg | ORAL_TABLET | Freq: Every day | ORAL | 0 refills | Status: DC

## 2021-07-21 NOTE — Discharge Instructions (Signed)
Please seek medical attention for any high fevers, chest pain, shortness of breath, change in behavior, persistent vomiting, bloody stool or any other new or concerning symptoms.  

## 2021-07-21 NOTE — ED Provider Notes (Signed)
[facility] Provider Note    Event Date/Time   First MD Initiated Contact with Patient 07/21/21 [phone]     (approximate)   History   Shoulder Pain   HPI  Tina Brennan is a 46 y.o. female  who, per outside hospital note dated 07/18/21 had left shoulder pain radiating to elbow for three days, presents to the emergency department today for continued left shoulder pain. She does state that it radiates down her left arm. She does not recall any trauma or abnormal use of the arm recently. Does have history of nerve pain to her right leg so has prescription for gabapentin which she has been taking but it has not relieved her left upper extremity issue. Additionally she has tried over the counter medications without any significant relief.    Physical Exam   Triage Vital Signs: ED Triage Vitals  Enc Vitals Group     BP 07/20/21 2346 (!) 190/110     Pulse Rate 07/20/21 2346 86     Resp 07/20/21 2346 16     Temp 07/20/21 2346 98.2 F (36.8 C)     Temp Source 07/20/21 2346 Oral     SpO2 07/20/21 2346 100 %     Weight 07/20/21 2345 184 lb (83.5 kg)     Height 07/20/21 2345 5\' 5"  (1.651 m)     Head Circumference --      Peak Flow --      Pain Score 07/20/21 2344 6     Pain Loc --      Pain Edu? --      Excl. in [place]? --     Most recent vital signs: Vitals:   07/20/21 2346 07/21/21 0415  BP: (!) 190/110 (!) 156/94  Pulse: 86 76  Resp: 16 18  Temp: 98.2 F (36.8 C) 98.2 F (36.8 C)  SpO2: 100% 95%    General: Awake, no distress.  CV:  Good peripheral perfusion.  Resp:  Normal effort.  Abd:  No distention.  MSK:  No deformity to left upper extremity. NV intact.      ED Results / Procedures / Treatments   Labs (all labs ordered are listed, but only abnormal results are displayed) Labs Reviewed  BASIC METABOLIC PANEL - Abnormal; Notable for the following components:      Result Value   Sodium 131 (*)    Glucose, Bld 209 (*)    All other  components within normal limits  CBC  TROPONIN I (HIGH SENSITIVITY)     EKG  I, Nance Pear, attending physician, personally viewed and interpreted this EKG  EKG Time: 2341 Rate: 87 Rhythm: normal sinus rhythm Axis: normal Intervals: qtc 442 QRS: narrow, q waves II, III ST changes: no st elevation Impression: abnormal ekg    RADIOLOGY Left shoulder x-ray I independently interpreted and visualized the left shoulder x-ray. My interpretation: no fracture. No dislocation    PROCEDURES:  Critical Care performed: No  Procedures   MEDICATIONS ORDERED IN ED: Medications - No data to display   IMPRESSION / MDM / [place] / ED COURSE  I reviewed the triage vital signs and the nursing notes.                              Differential diagnosis includes, but is not limited to, fracture, dislocation, soft tissue injury, radiculopathy.  Patient presents with left shoulder pain that radiates down  her arm. No recalled trauma or over use. On exam patient is NV intact. X-ray without concerning finding. Do think likely radiculopathy. Discussed this with the patient. Patient did feel better with medication. Given improvement in pain an no concerning physical exam findings do not think patient requires inpatient admission at this time. Will discharge with medication.  FINAL CLINICAL IMPRESSION(S) / ED DIAGNOSES   Final diagnoses:  Radiculopathy of cervicothoracic region     Rx / DC Orders   ED Discharge Orders          Ordered    oxyCODONE-acetaminophen (PERCOCET) 5-325 MG tablet  Every 8 hours PRN        07/21/21 0639             Note:  This document was prepared using Dragon voice recognition software and may include unintentional dictation errors.    Nance Pear, MD 07/21/21 [phone]

## 2021-07-22 ENCOUNTER — Other Ambulatory Visit: Payer: Self-pay

## 2021-07-24 ENCOUNTER — Other Ambulatory Visit: Payer: Self-pay

## 2021-07-25 ENCOUNTER — Other Ambulatory Visit: Payer: Self-pay

## 2021-08-05 ENCOUNTER — Other Ambulatory Visit: Payer: Self-pay

## 2021-08-12 ENCOUNTER — Other Ambulatory Visit: Payer: Self-pay

## 2021-08-13 ENCOUNTER — Encounter: Payer: Self-pay | Admitting: Emergency Medicine

## 2021-08-13 ENCOUNTER — Emergency Department
Admission: EM | Admit: 2021-08-13 | Discharge: 2021-08-14 | Disposition: A | Discharge status: Home / Self Care | Payer: Self-pay | Attending: Emergency Medicine | Admitting: Emergency Medicine

## 2021-08-13 ENCOUNTER — Other Ambulatory Visit: Payer: Self-pay

## 2021-08-13 DIAGNOSIS — I10 Essential (primary) hypertension: Secondary | ICD-10-CM | POA: Insufficient documentation

## 2021-08-13 DIAGNOSIS — M25512 Pain in left shoulder: Secondary | ICD-10-CM

## 2021-08-13 DIAGNOSIS — Z7984 Long term (current) use of oral hypoglycemic drugs: Secondary | ICD-10-CM | POA: Insufficient documentation

## 2021-08-13 DIAGNOSIS — Z79899 Other long term (current) drug therapy: Secondary | ICD-10-CM | POA: Insufficient documentation

## 2021-08-13 DIAGNOSIS — E119 Type 2 diabetes mellitus without complications: Secondary | ICD-10-CM | POA: Insufficient documentation

## 2021-08-13 DIAGNOSIS — Z8616 Personal history of COVID-19: Secondary | ICD-10-CM | POA: Insufficient documentation

## 2021-08-13 DIAGNOSIS — Z794 Long term (current) use of insulin: Secondary | ICD-10-CM | POA: Insufficient documentation

## 2021-08-13 MED ORDER — IBUPROFEN 800 MG PO TABS
800.0000 mg | ORAL_TABLET | Freq: Once | ORAL | Status: AC
  Administered 2021-08-14: 800 mg via ORAL
  Filled 2021-08-13: qty 1

## 2021-08-13 MED ORDER — OXYCODONE-ACETAMINOPHEN 5-325 MG PO TABS
2.0000 | ORAL_TABLET | Freq: Once | ORAL | Status: AC
  Administered 2021-08-14: 2 via ORAL
  Filled 2021-08-13: qty 2

## 2021-08-13 NOTE — ED Provider Notes (Signed)
[facility] Provider Note    Event Date/Time   First MD Initiated Contact with Patient 08/13/21 2325     (approximate)   History   Arm Pain   HPI  Tina Brennan is a 46 y.o. female with history of hypertension, diabetes who presents to the emergency department left shoulder pain that has been ongoing for weeks.  She is right-hand dominant.  She denies any known injury.  She has been seen by her primary care doctor and has had x-rays which were unremarkable.  States she has had a steroid injection which has not provided her with any relief.  She has not yet seen an orthopedist.  She states pain will radiate down the arm.  No numbness, weakness.  Pain is worse with trying to reach behind her back and raise her arm over her head.  It appears she was just prescribed 60 tablets of tramadol on March 9.  She denies that this is helping her pain.  She was seen in the emergency department on February 19 for the same and had an x-ray of the left shoulder which was unremarkable.   History provided by patient.    Past Medical History:  Diagnosis Date   COVID-19 06/2020   Diabetes mellitus without complication (HCC)    Hypertension    IBS (irritable bowel syndrome)    Kidney stones    Pancreatitis     Past Surgical History:  Procedure Laterality Date   ABDOMINAL HYSTERECTOMY     BLADDER SURGERY     cyst removal of ovaries      MEDICATIONS:  Prior to Admission medications   Medication Sig Start Date End Date Taking? Authorizing Provider  atorvastatin (LIPITOR) 10 MG tablet TAKE 2 TABLETS (20 MG) BY MOUTH ONCE DAILY AT BEDTIME FOR HIGH CHOLESTEROL 02/06/21 12/01/21    Bacillus Coagulans-Inulin (PROBIOTIC) 1-250 BILLION-MG CAPS Take 1 capsule by mouth daily. 10/05/20   Tyrees Chopin, Layla Maw, DO  buPROPion (WELLBUTRIN XL) 150 MG 24 hr tablet TAKE ONE TABLET BY MOUTH EVERY DAY 06/18/21 06/18/22    cephALEXin (KEFLEX) 500 MG capsule Take 1 capsule (500 mg total) by mouth 2  (two) times daily for 7 days. 02/19/21   Edessa Jakubowicz, Layla Maw, DO  Cinnamon 500 MG TABS Take 500 mg by mouth daily.    [provider]  ciprofloxacin (CIPRO) 500 MG tablet Take 1 tablet (500 mg total) by mouth 2 (two) times daily. 10/05/20   Jacorey Donaway, Layla Maw, DO  cloNIDine (CATAPRES) 0.1 MG tablet Take 2 tablets (0.2 mg total) by mouth 3 (three) times daily. Patient taking differently: Take 0.1 mg by mouth daily as needed. 10/07/18 10/07/19  Michiel Cowboy A, PA-C  dicyclomine (BENTYL) 20 MG tablet Take 1 tablet (20 mg total) by mouth 4 (four) times daily -  before meals and at bedtime. 11/27/19   Minna Antis, MD  dicyclomine (BENTYL) 20 MG tablet TAKE 1 TABLET BY MOUTH FOUR TIMES A DAY 06/04/21     gabapentin (NEURONTIN) 600 MG tablet TAKE ONE TABLET BY MOUTH 3 TIMES A DAY 06/04/21 06/04/22    HYDROcodone-acetaminophen (NORCO/VICODIN) 5-325 MG tablet Take 2 tablets by mouth every 6 (six) hours as needed. 03/15/21   Camber Ninh, Layla Maw, DO  ibuprofen (ADVIL) 800 MG tablet Take 1 tablet (800 mg total) by mouth every 8 (eight) hours as needed for mild pain. 03/15/21   Vollie Brunty, Layla Maw, DO  lisinopril (ZESTRIL) 40 MG tablet Take 1 tablet (40 mg total) by  mouth daily. 11/27/19   Minna Antis, MD  lisinopril (ZESTRIL) 40 MG tablet TAKE ONE TABLET BY MOUTH ONCE EVERY DAY FOR HIGH BLOOD PRESSURE. 06/04/21 06/04/22    metFORMIN (GLUCOPHAGE) 500 MG tablet Take 1 tablet (500 mg total) by mouth 2 (two) times daily with a meal. 04/08/19   Bast, Traci A, NP  metFORMIN (GLUCOPHAGE) 500 MG tablet TAKE ONE TABLET BY MOUTH 2 TIMES A DAY FOR DIABETES. 06/04/21 06/04/22    metoprolol succinate (TOPROL-XL) 100 MG 24 hr tablet TAKE 1 TABLET BY MOUTH ONCE DAILY TAKE  WITH  OR  IMMEDIATELY  FOLLOWING  A  MEAL 04/08/19   Bast, Traci A, NP  metoprolol tartrate (LOPRESSOR) 50 MG tablet TAKE ONE TABLET BY MOUTH 2 TIMES A DAY 06/18/21 06/18/22    metroNIDAZOLE (FLAGYL) 500 MG tablet Take 1 tablet (500 mg total) by mouth 3 (three) times  daily. 10/05/20   Verniece Encarnacion, Layla Maw, DO  omeprazole (PRILOSEC) 20 MG capsule Take 1 capsule (20 mg total) by mouth daily. 09/30/18   Tukov-Yual, Alroy Bailiff, NP  omeprazole (PRILOSEC) 20 MG capsule TAKE ONE CAPSULE BY MOUTH EVERY DAY FOR REFLUX 02/06/21 11/22/21    oxyCODONE-acetaminophen (PERCOCET) 5-325 MG tablet Take 2 tablets by mouth every 6 (six) hours as needed for severe pain. 10/05/20 10/05/21  Shawneequa Baldridge, Layla Maw, DO  oxyCODONE-acetaminophen (PERCOCET) 5-325 MG tablet Take 1 tablet by mouth every 8 (eight) hours as needed for severe pain. 07/21/21 07/21/22  Phineas Semen, MD  predniSONE (DELTASONE) 10 MG tablet Take 6 tablets by mouth on day 1, then take 5 tabs on day 2, then take 4 tabs on day 3, then take 3 tabs on day 4, then take 2 tabs on day 5, then take 1 tab on day 6. 07/21/21   Phineas Semen, MD  predniSONE (DELTASONE) 10 MG tablet TAKE 4 TABLETS (40MG ) BY MOUTH ONCE DAILY FOR 2 DAYS. THEN TAKE 3 TABS (30MG ) DAILY FOR 2 DAYS. THEN TAKE 2 TABS (20MG ) DAILY FOR 2 DAYS. THEN TAKE 1 TAB (10MG ) ONCE FOR 2 DAYS. 07/21/21   Phineas Semen, MD  promethazine (PHENERGAN) 25 MG tablet Take 1 tablet (25 mg total) by mouth every 6 (six) hours as needed for nausea or vomiting. 03/15/21   Kazaria Gaertner, Layla Maw, DO  triamcinolone cream (KENALOG) 0.1 % APPLY TOPICALLY TO THE AFFECTED AREA 2 TIMES A DAY AS NEEDED. 06/04/21       Physical Exam   Triage Vital Signs: ED Triage Vitals [08/13/21 2244]  Enc Vitals Group     BP 131/90     Pulse Rate 99     Resp 18     Temp 98.3 F (36.8 C)     Temp Source Oral     SpO2 98 %     Weight 187 lb (84.8 kg)     Height 5\' 4"  (1.626 m)     Head Circumference      Peak Flow      Pain Score      Pain Loc      Pain Edu?      Excl. in GC?     Most recent vital signs: Vitals:   08/13/21 2244  BP: 131/90  Pulse: 99  Resp: 18  Temp: 98.3 F (36.8 C)  SpO2: 98%     CONSTITUTIONAL: Alert and responds appropriately to questions. Well-appearing;  well-nourished HEAD: Normocephalic, atraumatic EYES: Conjunctivae clear, pupils appear equal ENT: normal nose; moist mucous membranes NECK: Normal range of motion, no  midline spinal tenderness or step-off or deformity CARD: Regular rate and rhythm RESP: Normal chest excursion without splinting or tachypnea; no hypoxia or respiratory distress, speaking full sentences ABD/GI: non-distended EXT: Patient has some tenderness diffusely over the left shoulder but no redness, warmth, ecchymosis, soft tissue swelling, deformity.  She has pain with lifting her arm above her head and behind her back but has full range of motion in the shoulder.  Her compartments of the left arm are soft.  She is a 2+ left radial pulse and normal capillary refill.  Normal sensation throughout the arm. SKIN: Normal color for age and race, no rashes on exposed skin NEURO: Moves all extremities equally, normal speech, no facial asymmetry noted PSYCH: The patient's mood and manner are appropriate. Grooming and personal hygiene are appropriate.  ED Results / Procedures / Treatments   LABS: (all labs ordered are listed, but only abnormal results are displayed) Labs Reviewed - No data to display   EKG:    RADIOLOGY: My personal review and interpretation of imaging:    I have personally reviewed all radiology reports. No results found.   PROCEDURES:  Critical Care performed: No   CRITICAL CARE Performed by: Rochele Raring   Total critical care time: 0 minutes  Critical care time was exclusive of separately billable procedures and treating other patients.  Critical care was necessary to treat or prevent imminent or life-threatening deterioration.  Critical care was time spent personally by me on the following activities: development of treatment plan with patient and/or surrogate as well as nursing, discussions with consultants, evaluation of patient's response to treatment, examination of patient, obtaining  history from patient or surrogate, ordering and performing treatments and interventions, ordering and review of laboratory studies, ordering and review of radiographic studies, pulse oximetry and re-evaluation of patient's condition.   Procedures    IMPRESSION / MDM / ASSESSMENT AND PLAN / ED COURSE  I reviewed the triage vital signs and the nursing notes.   Patient here with left shoulder pain ongoing for several weeks.     DIFFERENTIAL DIAGNOSIS (includes but not limited to):   Pain seems to be musculoskeletal in nature.  May be rotator cuff injury such as tendinitis, tear.  Bursitis, arthritis, strain, sprain also on the differential.  Doubt fracture, dislocation.  No signs of arterial obstruction, DVT, septic arthritis, gout, compartment syndrome.   PLAN: We will provide patient with pain medication.  I do not think this is her anginal equivalent.  She has already had x-rays as an outpatient.  I do not feel these need to be repeated emergently.  I feel she will need to follow-up with orthopedics as an outpatient for an MRI of her shoulder.   MEDICATIONS GIVEN IN ED: Medications  oxyCODONE-acetaminophen (PERCOCET/ROXICET) 5-325 MG per tablet 2 tablet (2 tablets Oral Given 08/14/21 0030)  ibuprofen (ADVIL) tablet 800 mg (800 mg Oral Given 08/14/21 0030)     ED COURSE: Patient reports pain improved after Percocet and ibuprofen.  Will discharge home with prescription of the same and outpatient orthopedic follow-up.  Have advised her not to take tramadol at the same time as Percocet.  I do not feel there is any utility in another steroid taper given she has done this twice without any relief.  Recommended rest, ice.  I do not feel a sling is indicated as I worry that it could cause her to develop adhesive capsulitis.  At this time, I do not feel there is any life-threatening condition  present. I reviewed all nursing notes, vitals, pertinent previous records.  All lab and urine results,  EKGs, imaging ordered have been independently reviewed and interpreted by myself.  I reviewed all available radiology reports from any imaging ordered this visit.  Based on my assessment, I feel the patient is safe to be discharged home without further emergent workup and can continue workup as an outpatient as needed. Discussed all findings, treatment plan as well as usual and customary return precautions with patient.  They verbalize understanding and are comfortable with this plan.  Outpatient follow-up has been provided as needed.  All questions have been answered.    CONSULTS: No emergent orthopedic consultation needed at this time.   OUTSIDE RECORDS REVIEWED: No previous pertinent records for review.         FINAL CLINICAL IMPRESSION(S) / ED DIAGNOSES   Final diagnoses:  Acute pain of left shoulder     Rx / DC Orders   ED Discharge Orders          Ordered    oxyCODONE-acetaminophen (PERCOCET/ROXICET) 5-325 MG tablet  Every 6 hours PRN        08/14/21 0140    ibuprofen (ADVIL) 800 MG tablet  Every 8 hours PRN        08/14/21 0140             Note:  This document was prepared using Dragon voice recognition software and may include unintentional dictation errors.   Won Kreuzer, Layla Maw, DO 08/14/21 [phone]

## 2021-08-13 NOTE — ED Triage Notes (Signed)
Pt presents via POV with complaints of left arm pain starting at the shoulder and radiating down towards her arms for the last several weeks. She states that she take gabapentin & Tramadol with mild improvement. She notes being seen by her PCP who administered steroid shots with no improvement in her pain. Denies CP or SOB. ?

## 2021-08-14 ENCOUNTER — Other Ambulatory Visit: Payer: Self-pay

## 2021-08-14 MED ORDER — OXYCODONE-ACETAMINOPHEN 5-325 MG PO TABS: 2.0000 | ORAL_TABLET | Freq: Four times a day (QID) | ORAL | 0 refills | Status: AC | PRN

## 2021-08-14 MED ORDER — IBUPROFEN 800 MG PO TABS: 800.0000 mg | ORAL_TABLET | Freq: Three times a day (TID) | ORAL | 0 refills | Status: AC | PRN

## 2021-08-14 NOTE — Discharge Instructions (Addendum)
Do not take percocet and tramadol together as they are both narcotic pain medications. ? ? ?You are being provided a prescription for opiates (also known as narcotics) for pain control.  Opiates can be addictive and should only be used when absolutely necessary for pain control when other alternatives do not work.  We recommend you only use them for the recommended amount of time and only as prescribed.  Please do not take with other sedative medications or alcohol.  Please do not drive, operate machinery, make important decisions while taking opiates.  Please note that these medications can be addictive and have high abuse potential.  Patients can become addicted to narcotics after only taking them for a few days.  Please keep these medications locked away from children, teenagers or any family members with history of substance abuse.  Narcotic pain medicine may also make you constipated.  You may use over-the-counter medications such as MiraLAX, Colace to prevent constipation.  If you become constipated, you may use over-the-counter enemas as needed.  Itching and nausea are also common side effects of narcotic pain medication.  If you develop uncontrolled vomiting or a rash, please stop these medications and seek medical care. ? ?

## 2021-08-14 NOTE — ED Notes (Signed)
Patient rocking back and forth in stretcher reporting severe left shoulder pain that shoots down to hand. Denies chest pain or shortness of breath. ?

## 2021-08-19 ENCOUNTER — Other Ambulatory Visit: Payer: Self-pay

## 2021-08-22 ENCOUNTER — Other Ambulatory Visit: Payer: Self-pay

## 2021-08-28 ENCOUNTER — Other Ambulatory Visit: Payer: Self-pay

## 2021-08-28 MED ORDER — GABAPENTIN 600 MG PO TABS
ORAL_TABLET | Freq: Three times a day (TID) | ORAL | 0 refills | Status: DC
  Filled 2021-08-28: qty 270, fill #0
  Filled 2021-09-02: qty 90, 30d supply, fill #0
  Filled 2021-10-04: qty 90, 30d supply, fill #1
  Filled 2021-10-23: qty 90, 30d supply, fill #0

## 2021-09-02 ENCOUNTER — Other Ambulatory Visit: Payer: Self-pay

## 2021-09-03 ENCOUNTER — Other Ambulatory Visit: Payer: Self-pay

## 2021-09-03 MED ORDER — LISINOPRIL 40 MG PO TABS
ORAL_TABLET | Freq: Every day | ORAL | 2 refills | Status: AC
  Filled 2021-09-03 – 2021-10-23 (×2): qty 90, 90d supply, fill #0

## 2021-09-10 ENCOUNTER — Other Ambulatory Visit: Payer: Self-pay

## 2021-09-23 ENCOUNTER — Other Ambulatory Visit: Payer: Self-pay

## 2021-09-23 MED ORDER — DICYCLOMINE HCL 20 MG PO TABS
20.0000 mg | ORAL_TABLET | Freq: Four times a day (QID) | ORAL | 3 refills | Status: AC
  Filled 2021-09-23 – 2021-10-23 (×2): qty 120, 30d supply, fill #0

## 2021-09-27 ENCOUNTER — Other Ambulatory Visit: Payer: Self-pay

## 2021-10-01 ENCOUNTER — Other Ambulatory Visit: Payer: Self-pay

## 2021-10-01 MED ORDER — CLINDAMYCIN HCL 300 MG PO CAPS
300.0000 mg | ORAL_CAPSULE | Freq: Four times a day (QID) | ORAL | 0 refills | Status: AC
  Filled 2021-10-01: qty 28, 7d supply, fill #0

## 2021-10-03 ENCOUNTER — Other Ambulatory Visit: Payer: Self-pay

## 2021-10-04 ENCOUNTER — Other Ambulatory Visit: Payer: Self-pay

## 2021-10-23 ENCOUNTER — Other Ambulatory Visit: Payer: Self-pay

## 2021-10-24 ENCOUNTER — Other Ambulatory Visit: Payer: Self-pay

## 2021-12-04 ENCOUNTER — Other Ambulatory Visit: Payer: Self-pay

## 2021-12-05 ENCOUNTER — Other Ambulatory Visit: Payer: Self-pay

## 2021-12-05 MED ORDER — GABAPENTIN 600 MG PO TABS
ORAL_TABLET | Freq: Three times a day (TID) | ORAL | 0 refills | Status: AC
  Filled 2021-12-05: qty 270, 90d supply, fill #0

## 2021-12-12 ENCOUNTER — Other Ambulatory Visit: Payer: Self-pay

## 2021-12-17 ENCOUNTER — Other Ambulatory Visit: Payer: Self-pay

## 2021-12-18 ENCOUNTER — Other Ambulatory Visit: Payer: Self-pay

## 2022-01-16 ENCOUNTER — Other Ambulatory Visit: Payer: Self-pay

## 2022-01-29 ENCOUNTER — Other Ambulatory Visit: Payer: Self-pay

## 2022-10-21 ENCOUNTER — Other Ambulatory Visit: Payer: Self-pay

## 2023-05-08 IMAGING — CT CT ABD-PELV W/ CM
2 of 5 series · 16 of 46 positions shown, 18 images · IV contrast (APPLIED)
Comparison: 10/05/2020

CLINICAL DATA: Acute, nonlocalized abdominal pain

EXAM:
CT ABDOMEN AND PELVIS WITH CONTRAST
TECHNIQUE: Multidetector CT imaging of the abdomen and pelvis was performed
using the standard protocol following bolus administration of
intravenous contrast.
CONTRAST:  80mL OMNIPAQUE IOHEXOL 350 MG/ML SOLN

[Series 2: routine abd/pel with · axial · 0.89mm/px · z∈[-542,-92]mm · 13 of 102 slices shown, 15 images]
[im 6/102  soft-tissue]
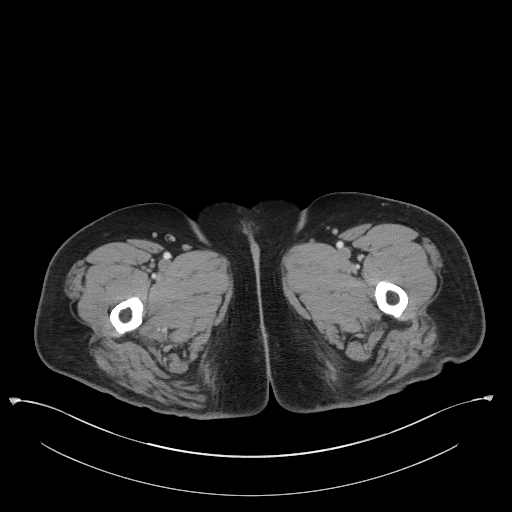
[im 6/102  bone]
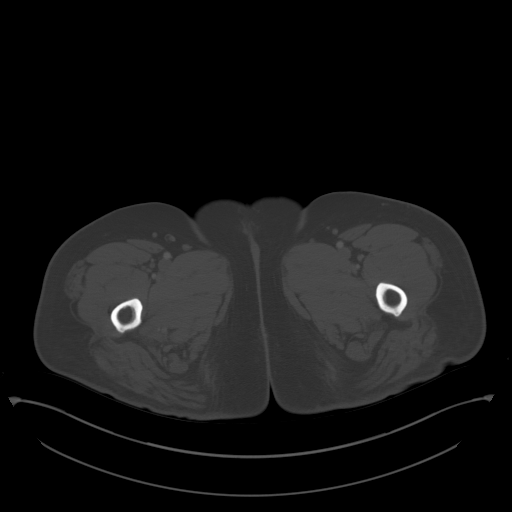
[im 12/102  soft-tissue]
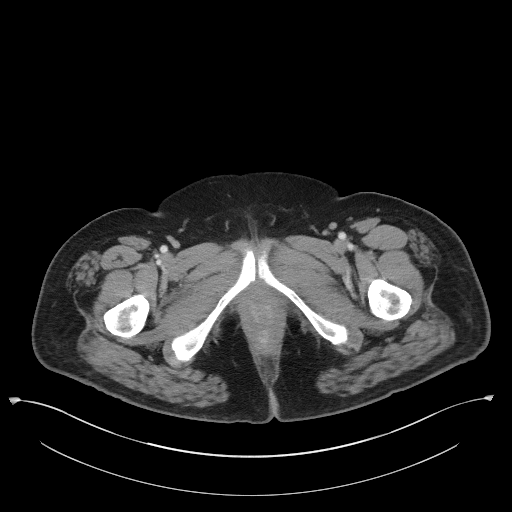
[im 23/102  soft-tissue]
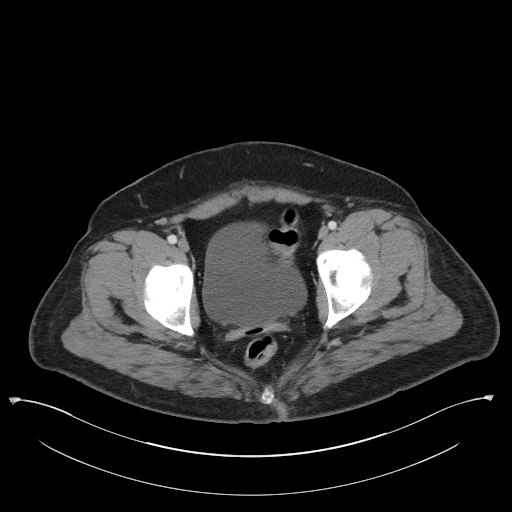
[im 29/102  soft-tissue]
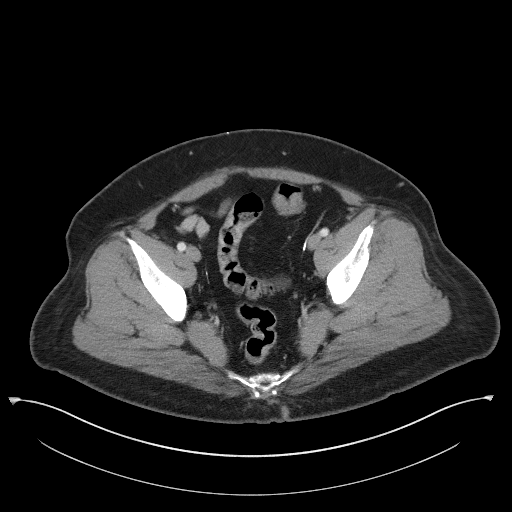
[im 34/102  soft-tissue]
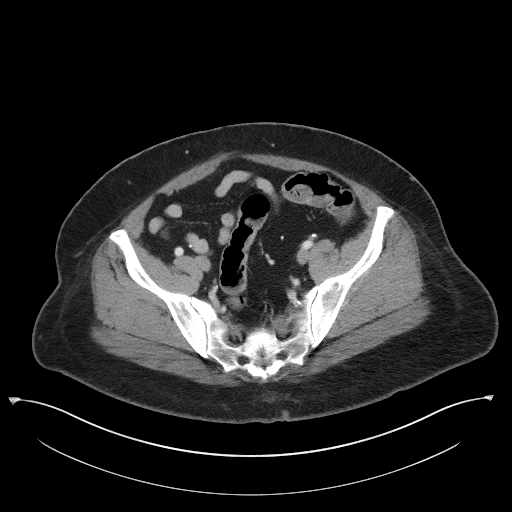
[im 45/102  soft-tissue]
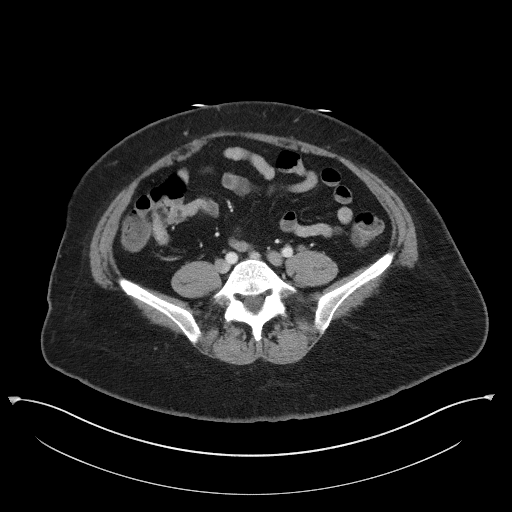
[im 51/102  soft-tissue]
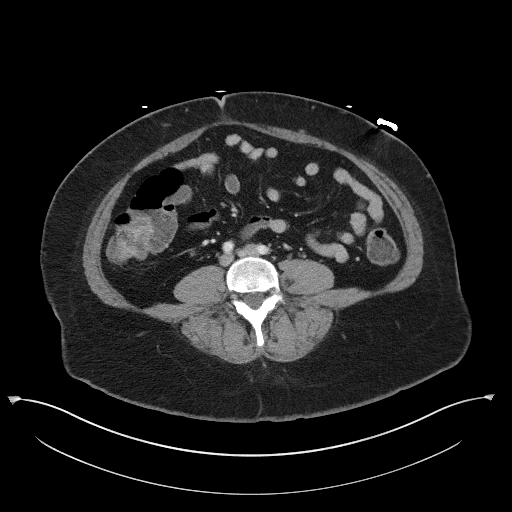
[im 57/102  soft-tissue]
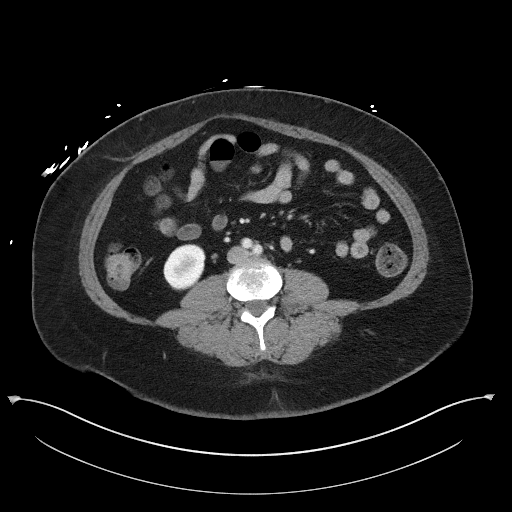
[im 68/102  soft-tissue]
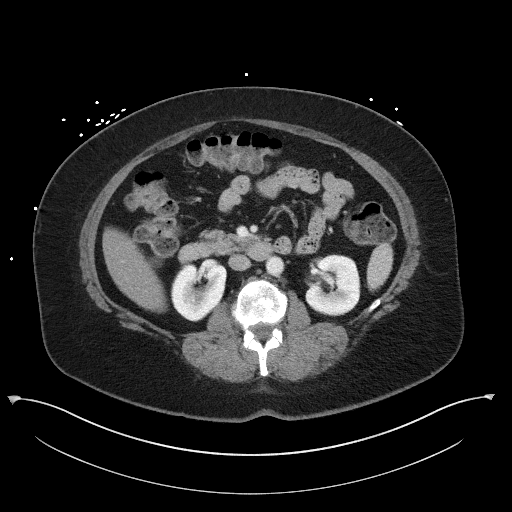
[im 68/102  bone]
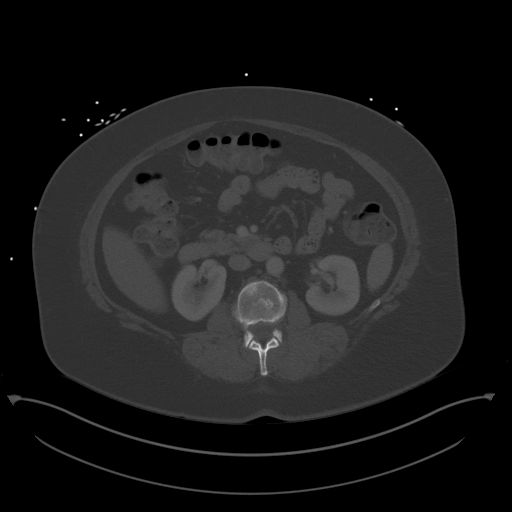
[im 73/102  soft-tissue]
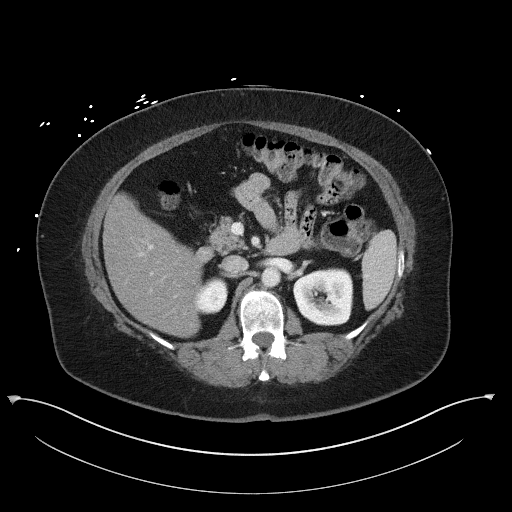
[im 79/102  soft-tissue]
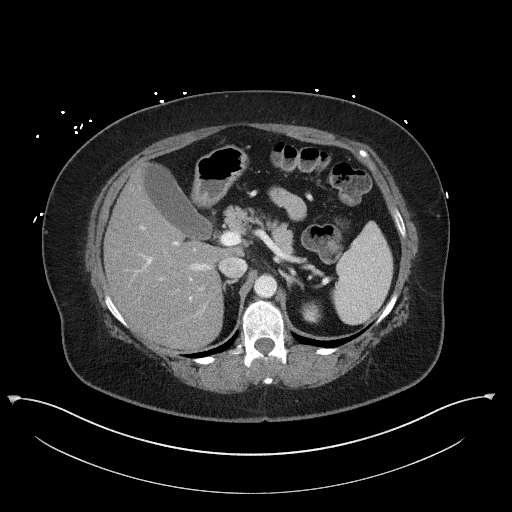
[im 90/102  soft-tissue]
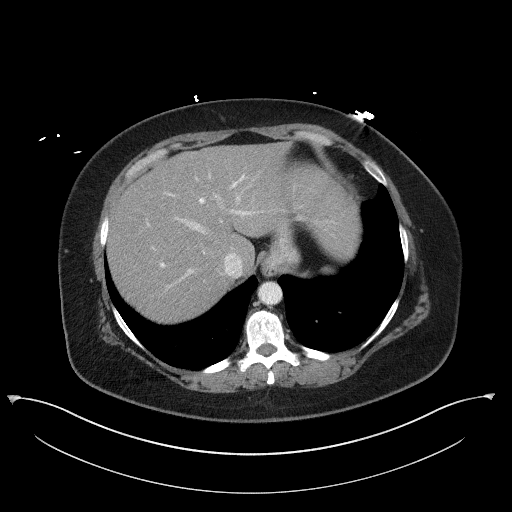
[im 96/102  soft-tissue]
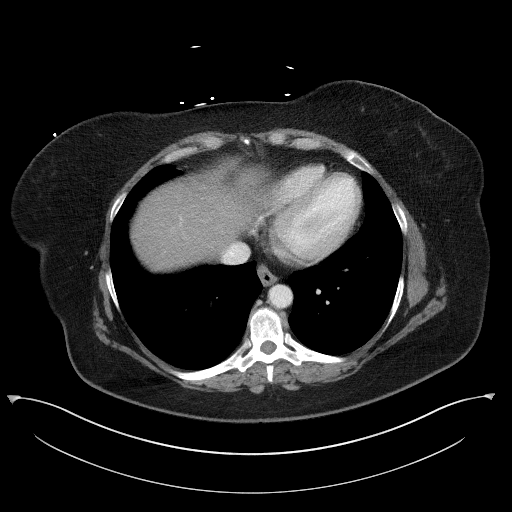

[Series 5: coronal st · coronal · 0.74mm/px · 3 of 104 slices shown]
[im 35/104  soft-tissue]
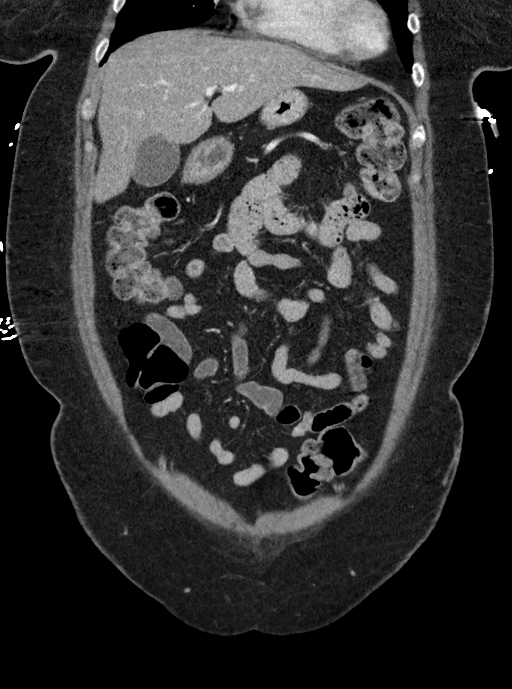
[im 46/104  soft-tissue]
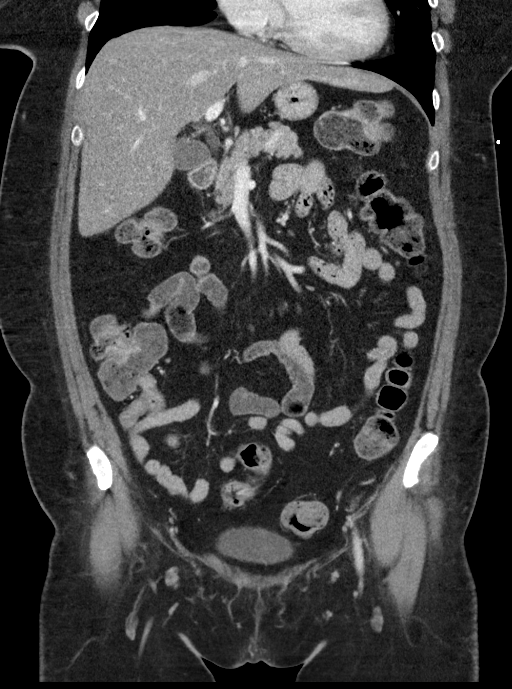
[im 58/104  soft-tissue]
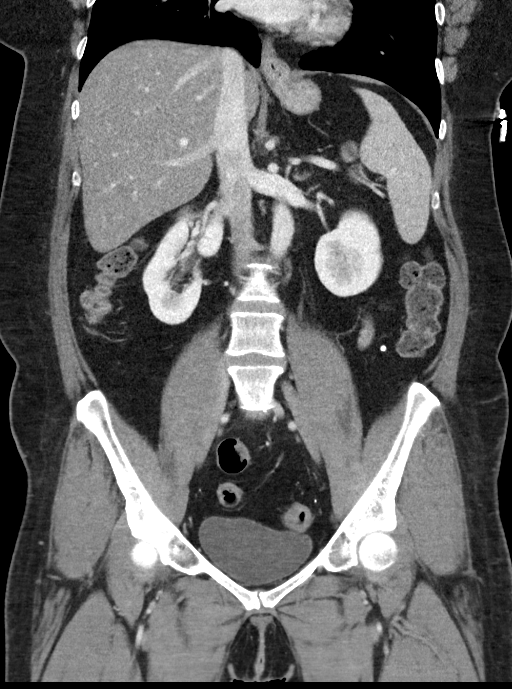

[16 of 46 positions shown; findings below may reference images not displayed]

FINDINGS: Lower chest:  No contributory findings.

Hepatobiliary: Hepatic steatosis.No evidence of biliary obstruction
or stone.

Pancreas: Unremarkable.

Spleen: Unremarkable.

Adrenals/Urinary Tract: Negative adrenals. No hydronephrosis or
stone. Unremarkable bladder.

Stomach/Bowel:  No obstruction. No visible bowel inflammation.

Vascular/Lymphatic: No acute vascular abnormality. No mass or
adenopathy.

Reproductive:Hysterectomy.

Other: No ascites or pneumoperitoneum.

Musculoskeletal: No acute abnormalities.
IMPRESSION: 1. No acute finding or specific cause for pain.
2. Hepatic steatosis.

## 2023-09-12 IMAGING — CR DG SHOULDER 2+V*L*
1 series · 4 of 4 positions shown · non-contrast
Comparison: 06/25/2020

CLINICAL DATA: Left-sided shoulder pain

EXAM:
LEFT SHOULDER - 2+ VIEW

[Series 1: dg shoulder left · 0.14mm/px · 4 of 4 slices shown]
[im 1/4]
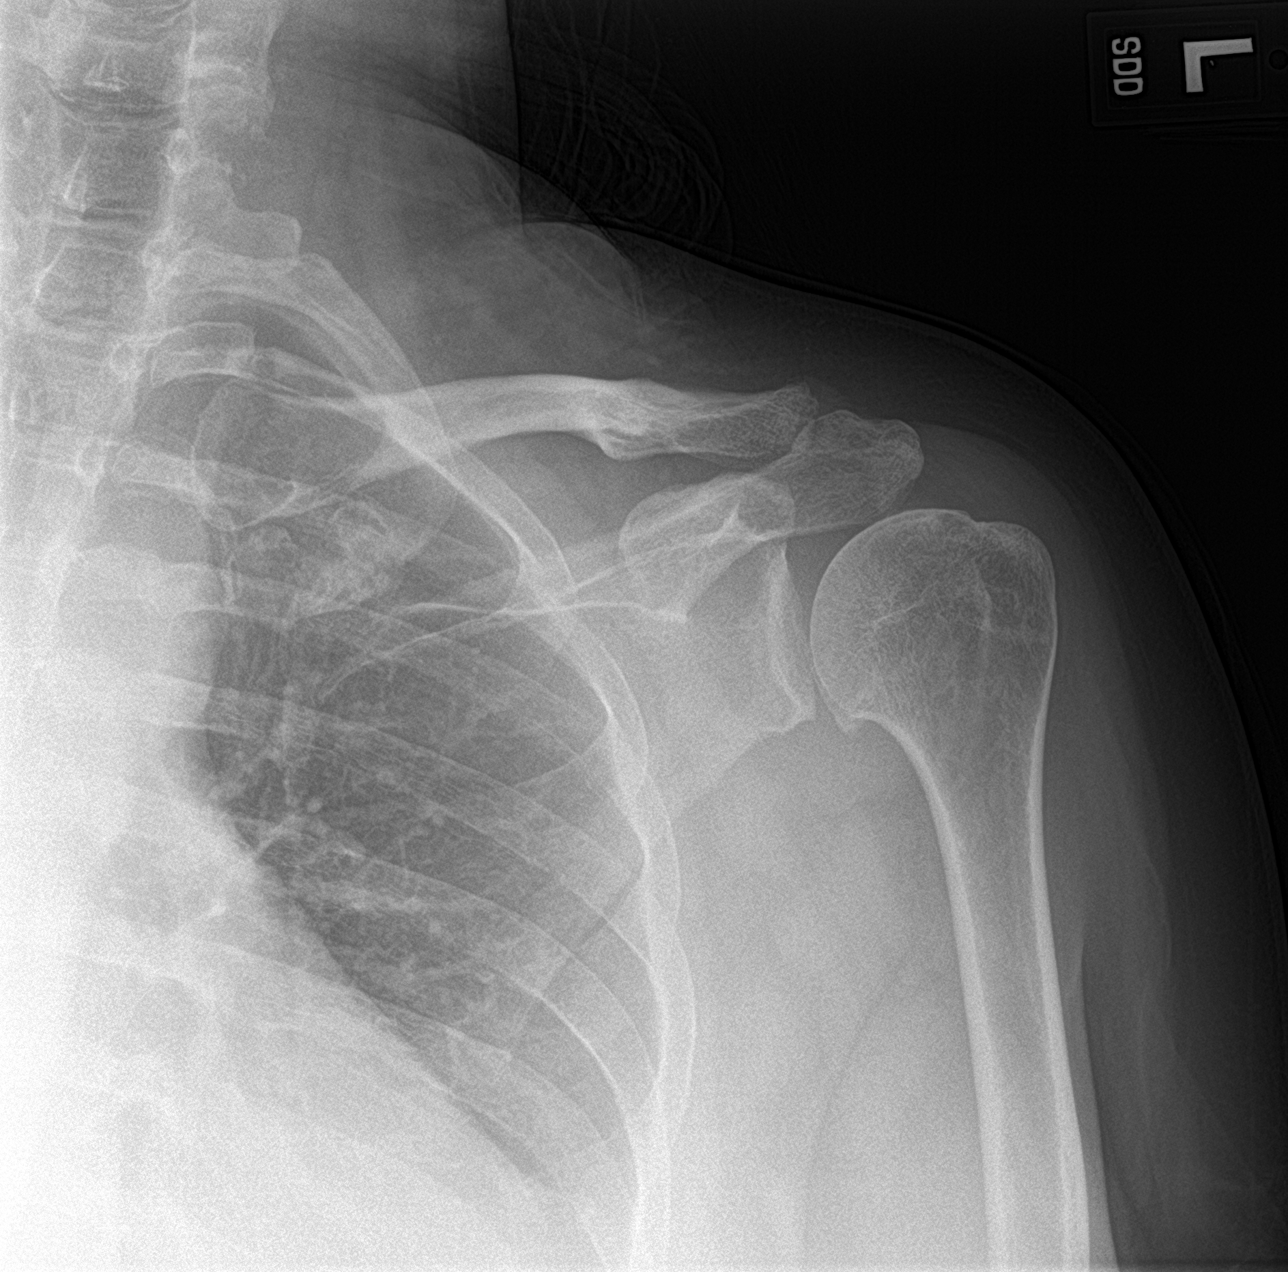
[im 2/4]
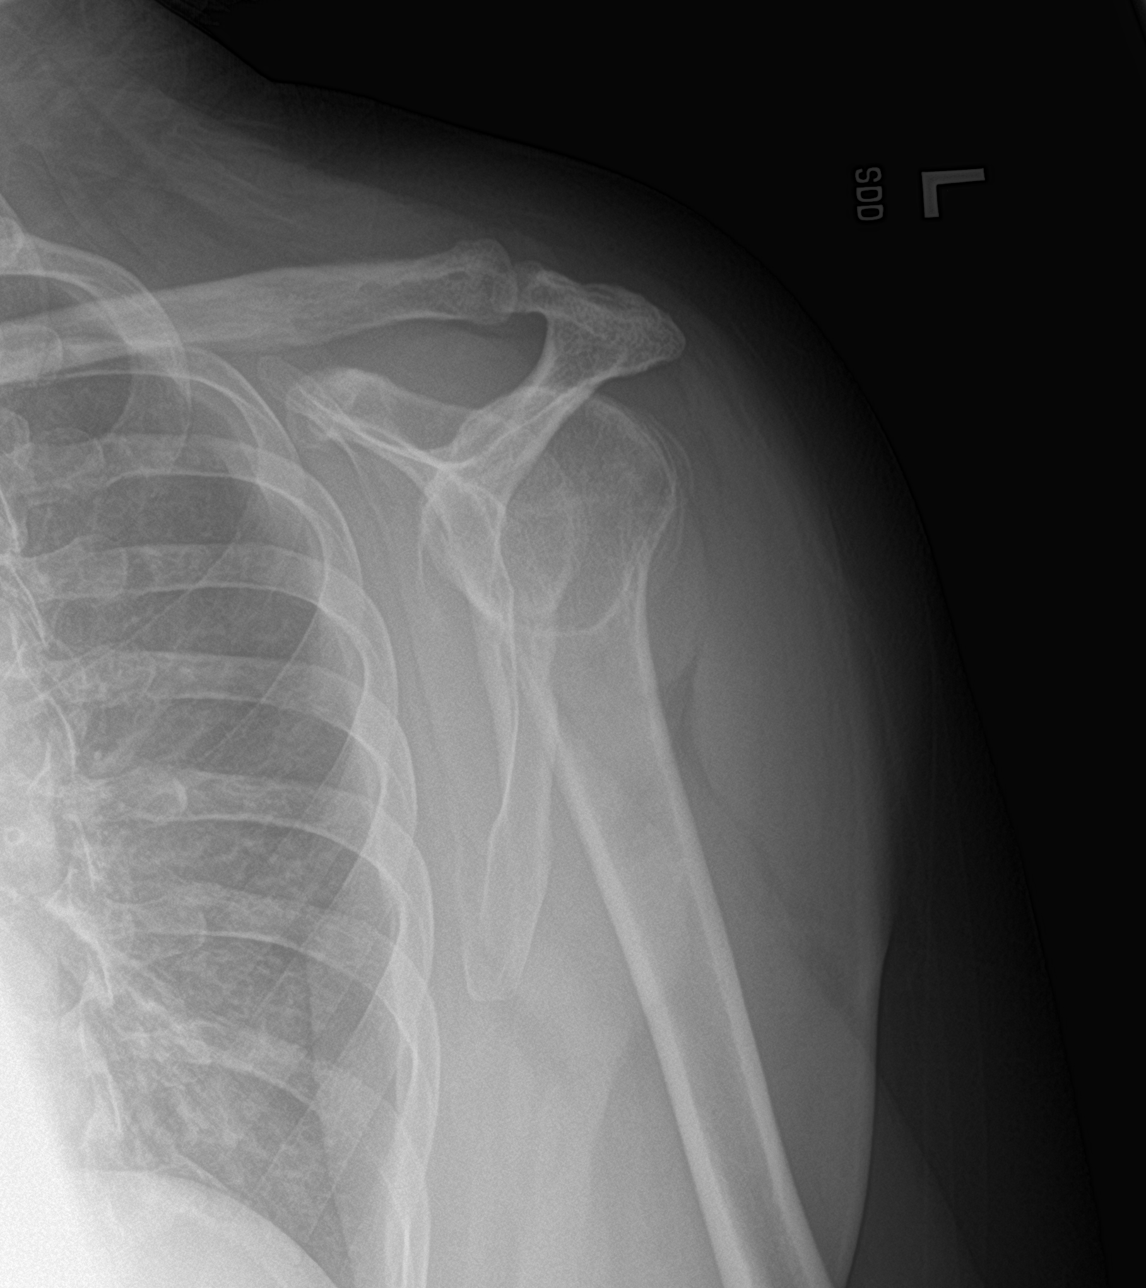
[im 3/4]
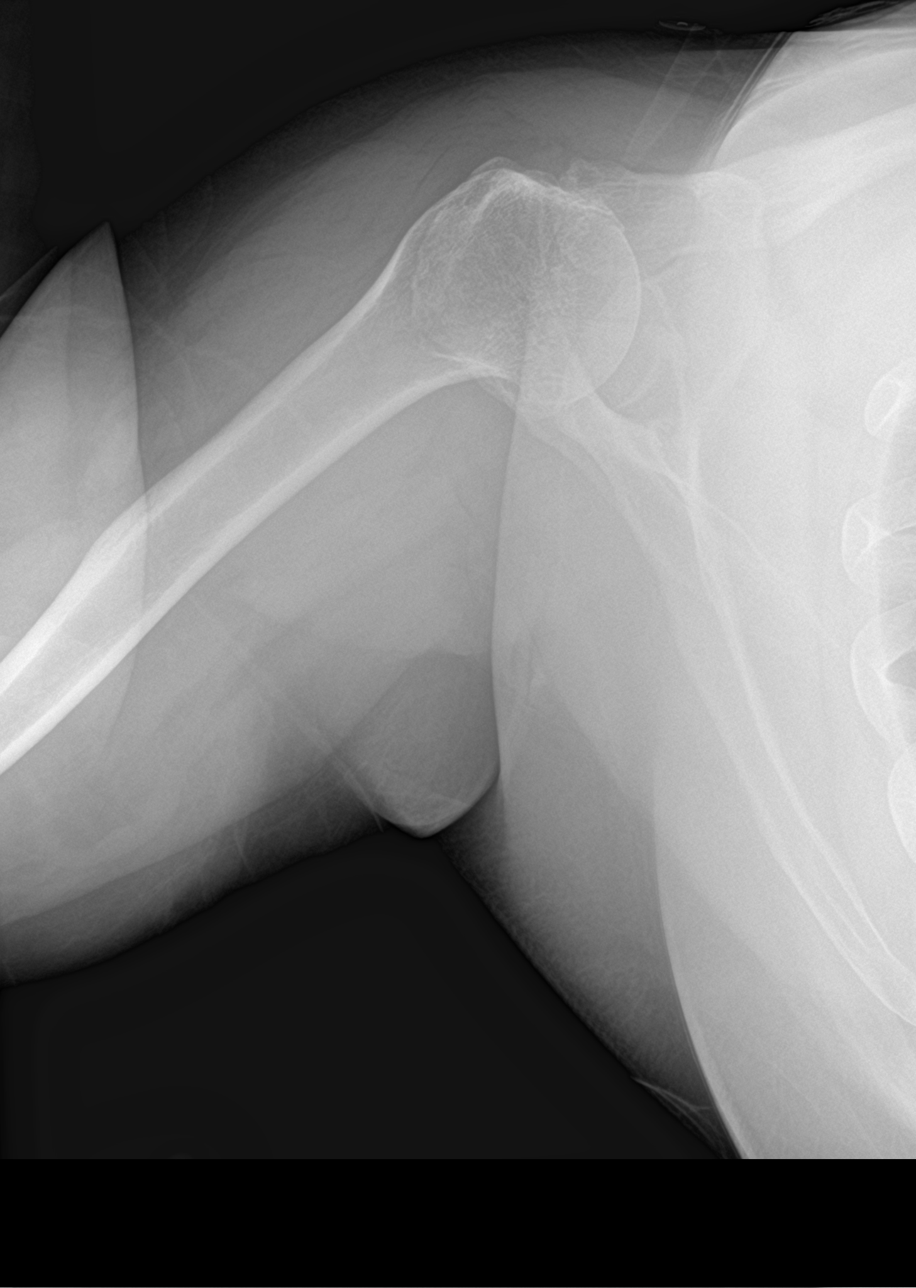
[im 4/4]
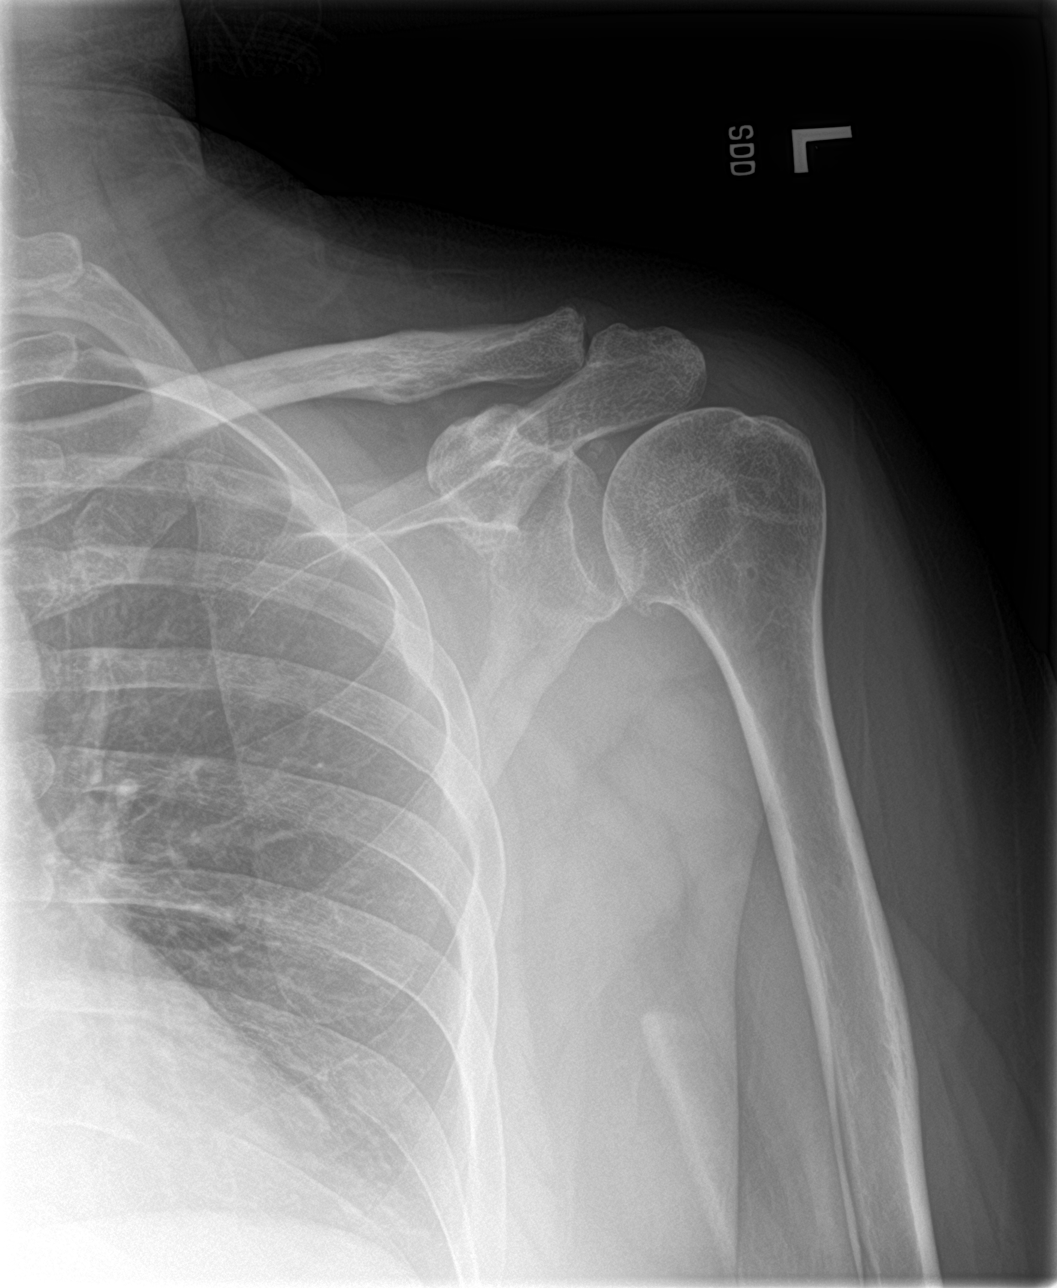

[4 of 4 positions shown; findings below may reference images not displayed]

FINDINGS: Degenerative changes of the acromioclavicular and glenohumeral
joints are seen. No acute fracture or dislocation is noted. No soft
tissue abnormality is seen.
IMPRESSION: No change without acute abnormality.
